# Patient Record
Sex: Female | Born: 1935
Health system: Southern US, Community
[De-identification: ages and names within clinical notes are randomized; demographics above are authoritative.]

## PROBLEM LIST (undated history)

## (undated) DIAGNOSIS — R269 Unspecified abnormalities of gait and mobility: Secondary | ICD-10-CM

## (undated) DIAGNOSIS — I340 Nonrheumatic mitral (valve) insufficiency: Secondary | ICD-10-CM

## (undated) DIAGNOSIS — I10 Essential (primary) hypertension: Secondary | ICD-10-CM

## (undated) DIAGNOSIS — R519 Headache, unspecified: Secondary | ICD-10-CM

## (undated) DIAGNOSIS — M069 Rheumatoid arthritis, unspecified: Secondary | ICD-10-CM

## (undated) DIAGNOSIS — R413 Other amnesia: Secondary | ICD-10-CM

## (undated) DIAGNOSIS — R55 Syncope and collapse: Secondary | ICD-10-CM

## (undated) DIAGNOSIS — R51 Headache: Secondary | ICD-10-CM

## (undated) DIAGNOSIS — E785 Hyperlipidemia, unspecified: Secondary | ICD-10-CM

## (undated) DIAGNOSIS — K219 Gastro-esophageal reflux disease without esophagitis: Secondary | ICD-10-CM

## (undated) HISTORY — PX: NASAL SINUS SURGERY: SHX719

## (undated) HISTORY — DX: Nonrheumatic mitral (valve) insufficiency: I34.0

## (undated) HISTORY — DX: Essential (primary) hypertension: I10

## (undated) HISTORY — DX: Headache: R51

## (undated) HISTORY — DX: Headache, unspecified: R51.9

## (undated) HISTORY — PX: SHOULDER SURGERY: SHX246

## (undated) HISTORY — DX: Other amnesia: R41.3

## (undated) HISTORY — PX: ABDOMINAL HYSTERECTOMY: SHX81

## (undated) HISTORY — DX: Syncope and collapse: R55

## (undated) HISTORY — DX: Rheumatoid arthritis, unspecified: M06.9

## (undated) HISTORY — DX: Hyperlipidemia, unspecified: E78.5

## (undated) HISTORY — DX: Unspecified abnormalities of gait and mobility: R26.9

## (undated) HISTORY — DX: Gastro-esophageal reflux disease without esophagitis: K21.9

---

## 2002-01-27 ENCOUNTER — Ambulatory Visit (HOSPITAL_BASED_OUTPATIENT_CLINIC_OR_DEPARTMENT_OTHER): Admission: RE | Admit: 2002-01-27 | Discharge: 2002-01-27 | Payer: Self-pay | Admitting: *Deleted

## 2002-01-27 ENCOUNTER — Encounter (INDEPENDENT_AMBULATORY_CARE_PROVIDER_SITE_OTHER): Payer: Self-pay | Admitting: *Deleted

## 2002-11-23 ENCOUNTER — Ambulatory Visit (HOSPITAL_COMMUNITY): Admission: RE | Admit: 2002-11-23 | Discharge: 2002-11-24 | Payer: Self-pay | Admitting: Internal Medicine

## 2004-09-23 ENCOUNTER — Ambulatory Visit: Payer: Self-pay | Admitting: Internal Medicine

## 2005-01-09 ENCOUNTER — Ambulatory Visit: Payer: Self-pay

## 2005-01-15 ENCOUNTER — Ambulatory Visit: Payer: Self-pay | Admitting: Internal Medicine

## 2005-01-21 ENCOUNTER — Ambulatory Visit: Payer: Self-pay | Admitting: Internal Medicine

## 2005-01-27 ENCOUNTER — Ambulatory Visit: Payer: Self-pay | Admitting: Internal Medicine

## 2005-01-27 ENCOUNTER — Ambulatory Visit (HOSPITAL_COMMUNITY): Admission: RE | Admit: 2005-01-27 | Discharge: 2005-01-27 | Payer: Self-pay | Admitting: Internal Medicine

## 2005-02-12 ENCOUNTER — Ambulatory Visit: Payer: Self-pay

## 2005-06-02 ENCOUNTER — Ambulatory Visit: Payer: Self-pay

## 2005-10-02 ENCOUNTER — Ambulatory Visit: Payer: Self-pay

## 2006-01-02 ENCOUNTER — Ambulatory Visit: Payer: Self-pay | Admitting: Internal Medicine

## 2006-05-07 ENCOUNTER — Ambulatory Visit: Payer: Self-pay

## 2006-08-18 ENCOUNTER — Ambulatory Visit: Payer: Self-pay | Admitting: Internal Medicine

## 2006-10-02 ENCOUNTER — Ambulatory Visit (HOSPITAL_COMMUNITY): Admission: RE | Admit: 2006-10-02 | Discharge: 2006-10-02 | Payer: Self-pay | Admitting: Orthopedic Surgery

## 2006-10-12 ENCOUNTER — Encounter: Admission: RE | Admit: 2006-10-12 | Discharge: 2006-10-12 | Payer: Self-pay | Admitting: Orthopedic Surgery

## 2006-10-14 ENCOUNTER — Ambulatory Visit (HOSPITAL_BASED_OUTPATIENT_CLINIC_OR_DEPARTMENT_OTHER): Admission: RE | Admit: 2006-10-14 | Discharge: 2006-10-14 | Payer: Self-pay | Admitting: Orthopedic Surgery

## 2006-12-23 ENCOUNTER — Ambulatory Visit: Payer: Self-pay | Admitting: Internal Medicine

## 2007-03-29 ENCOUNTER — Ambulatory Visit: Payer: Self-pay | Admitting: Internal Medicine

## 2007-03-29 LAB — CONVERTED CEMR LAB
Basophils Absolute: 0 10*3/uL (ref 0.0–0.1)
Eosinophils Absolute: 0.2 10*3/uL (ref 0.0–0.6)
HCT: 37.3 % (ref 36.0–46.0)
INR: 0.9 (ref 0.9–2.0)
Lymphocytes Relative: 32.2 % (ref 12.0–46.0)
MCHC: 35.7 g/dL (ref 30.0–36.0)
MCV: 101.3 fL — ABNORMAL HIGH (ref 78.0–100.0)
Monocytes Absolute: 0.5 10*3/uL (ref 0.2–0.7)
Monocytes Relative: 6.1 % (ref 3.0–11.0)
Neutro Abs: 5.3 10*3/uL (ref 1.4–7.7)
RDW: 12.4 % (ref 11.5–14.6)

## 2007-04-07 ENCOUNTER — Ambulatory Visit (HOSPITAL_COMMUNITY): Admission: RE | Admit: 2007-04-07 | Discharge: 2007-04-07 | Payer: Self-pay | Admitting: Internal Medicine

## 2007-04-07 ENCOUNTER — Ambulatory Visit: Payer: Self-pay | Admitting: Internal Medicine

## 2007-04-14 ENCOUNTER — Ambulatory Visit: Payer: Self-pay

## 2007-06-28 ENCOUNTER — Encounter: Admission: RE | Admit: 2007-06-28 | Discharge: 2007-06-28 | Payer: Self-pay | Admitting: Orthopedic Surgery

## 2008-04-26 IMAGING — CR DG CHEST 2V
2 series · 2 of 2 positions shown · non-contrast
Comparison: 10/12/06.

CLINICAL DATA: Pre-hardware removal.
 CHEST - 2 VIEW:

[view not recorded (1 of 2)]
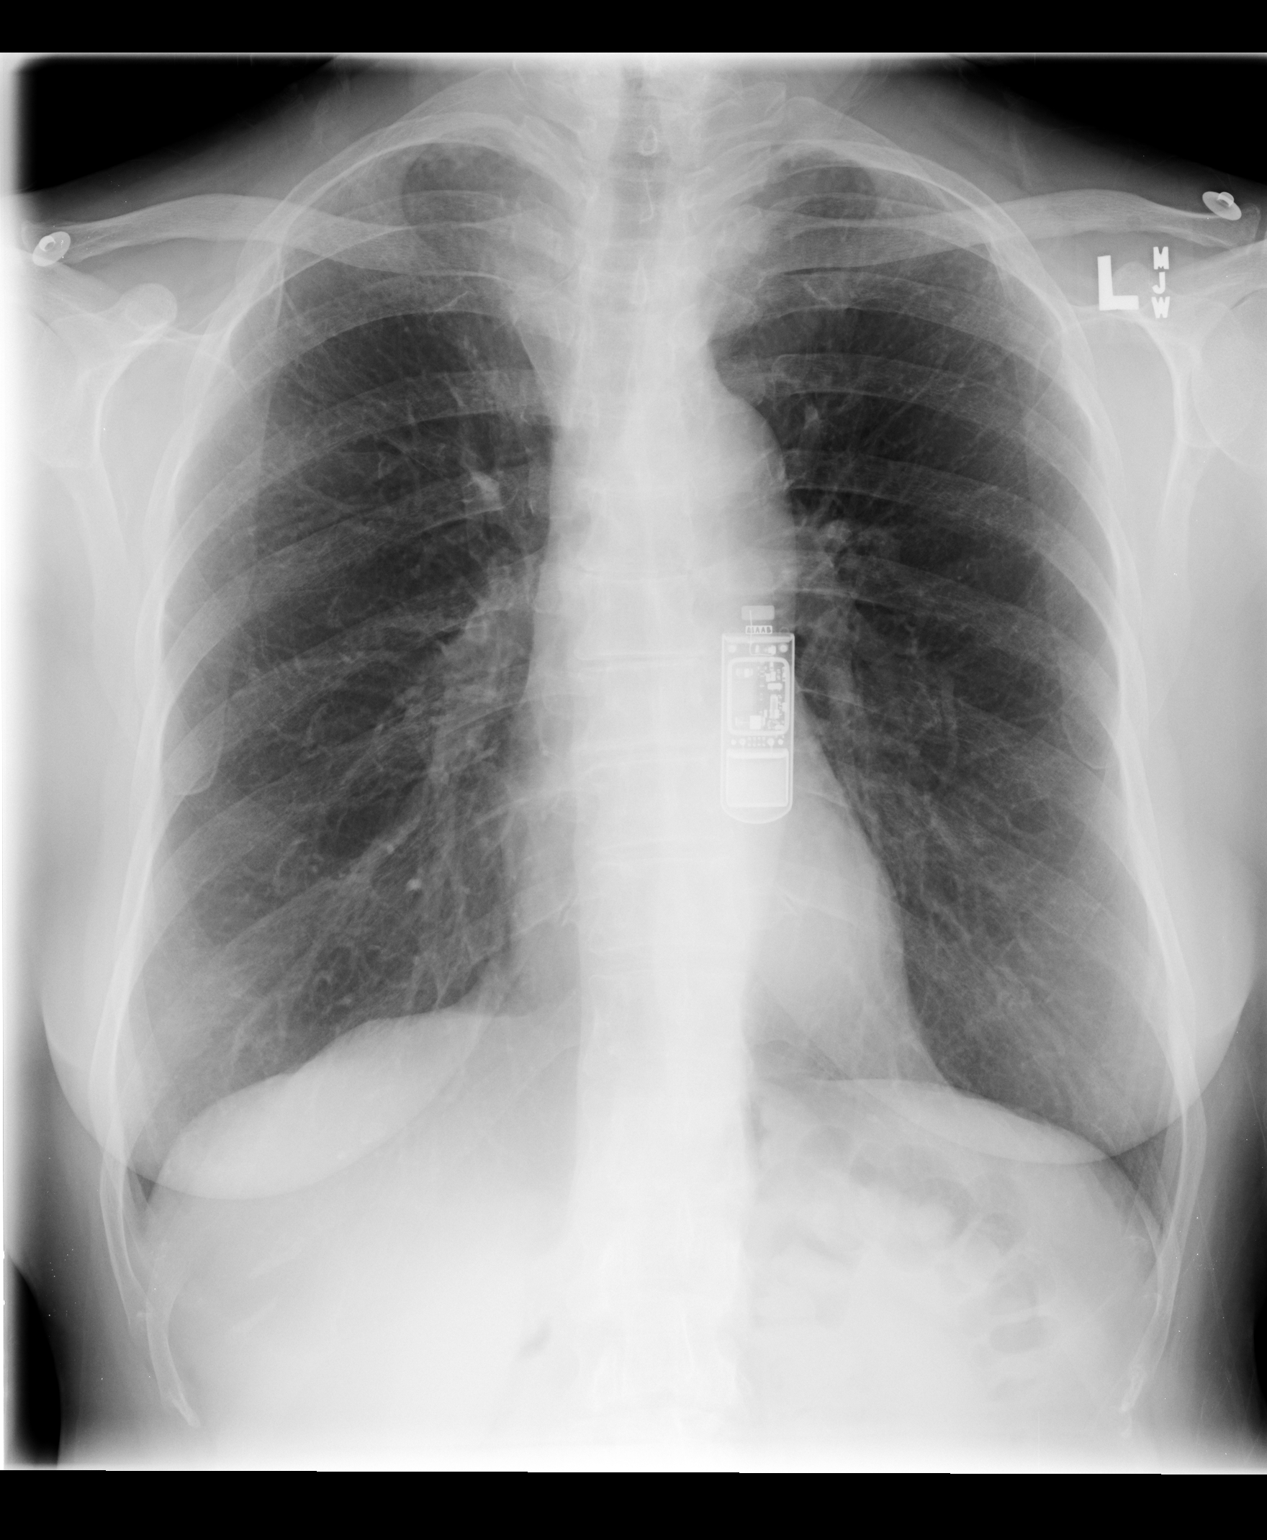

[view not recorded (2 of 2)]
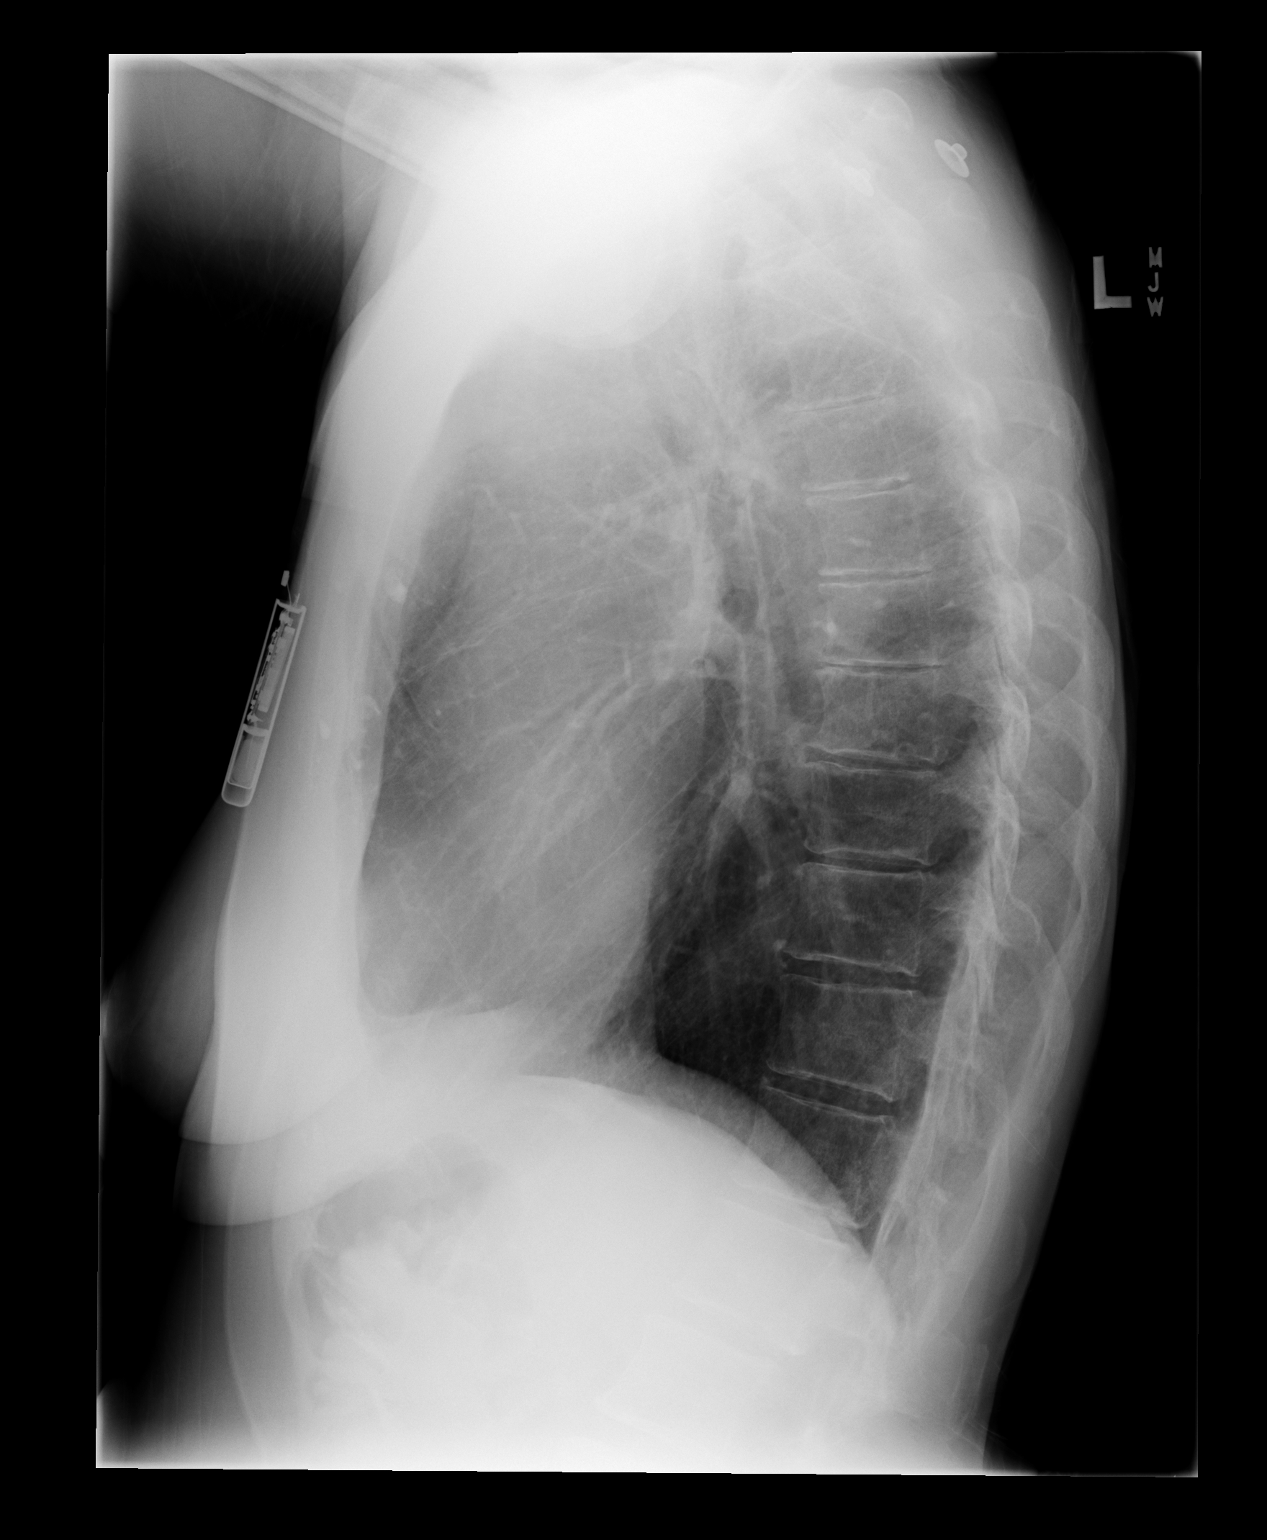

[2 of 2 positions shown; findings below may reference images not displayed]

FINDINGS: The trachea is midline. Heart size is normal. The lungs are clear. No pleural fluid.
IMPRESSION: No acute findings.

## 2008-09-08 HISTORY — PX: KNEE SURGERY: SHX244

## 2010-05-02 ENCOUNTER — Ambulatory Visit: Payer: Self-pay | Admitting: Cardiovascular Disease

## 2010-05-06 ENCOUNTER — Telehealth: Payer: Self-pay | Admitting: Cardiovascular Disease

## 2010-05-15 ENCOUNTER — Telehealth (INDEPENDENT_AMBULATORY_CARE_PROVIDER_SITE_OTHER): Payer: Self-pay | Admitting: *Deleted

## 2010-05-15 ENCOUNTER — Telehealth: Payer: Self-pay | Admitting: Cardiovascular Disease

## 2010-05-22 ENCOUNTER — Telehealth: Payer: Self-pay | Admitting: Cardiovascular Disease

## 2010-05-28 ENCOUNTER — Ambulatory Visit: Payer: Self-pay | Admitting: Cardiovascular Disease

## 2010-06-03 ENCOUNTER — Telehealth: Payer: Self-pay | Admitting: Cardiovascular Disease

## 2010-08-26 ENCOUNTER — Ambulatory Visit: Payer: Self-pay | Admitting: Cardiovascular Disease

## 2010-10-08 NOTE — Progress Notes (Signed)
  Phone Note Outgoing Call   Call placed by: Dessie Coma  LPN,  May 22, 2010 9:13 AM Call placed to: Patient Summary of Call: Patient notified per Dr. Kirke Corin, Echo was normal.

## 2010-10-08 NOTE — Progress Notes (Signed)
Summary: HEADACHE WITH NEW MEDICATION  Phone Note Call from Patient   Summary of Call: PT CALLED STATING SINCE STARTING BYSTOLIC SHE HAS HAD TERRIBLE HEADACHES.  IS THIS A SIDE EFFECT AND WHAT SHOULD SHE DO? Initial call taken by: Park Breed,  May 06, 2010 3:41 PM  Follow-up for Phone Call        Phone Call Completed:Advised per DR. Arida, need to cut the Bystolic in half.  It is a side effect to have bad headacaches.  It h/a does not get better, may have to switch to something else.  Follow-up by: Dessie Coma  LPN,  May 06, 2010 4:09 PM

## 2010-10-08 NOTE — Progress Notes (Signed)
Summary: QUESTION ABOUT PRES DOSE  Phone Note Call from Patient   Summary of Call: HAS PRESCRIPTION FOR BYSTOLIC AND WANTS TO CHECK DOSAGE BEFORE GETTING IT FILLED.  PLEASE CALL HER AT [phone] AND LET HER KNOW CORRECT DOSE.  [phone] Initial call taken by: Park Breed,  June 03, 2010 9:52 AM  Follow-up for Phone Call        Phone Call Completed:  PATIENT NOTIFIED CURRENTLY SHOULD BE TAKING BYSTOLIC 10MG  AS ORDERED BY DR. Kirke Corin ON THE 20TH OF SEPT.  PATIENT VOICED UNDERSTANDING.  Follow-up by: Dessie Coma  LPN,  June 03, 2010 10:21 AM

## 2010-10-08 NOTE — Progress Notes (Signed)
----   Converted from flag ---- ---- 05/15/2010 9:34 AM, Marilynne Halsted, CMA, AAMA wrote: Pt has Blue MCR.  No precert required for either [facility] or [facility] for 2D echo  ---- 05/15/2010 9:27 AM, Dessie Coma  LPN wrote: Echo scheduled at [facility]. for Friday 05/17/10 at [facility]. office.  Dx:401.1.  Thanks, Victorino Dike ------------------------------

## 2010-10-08 NOTE — Progress Notes (Signed)
  Phone Note Outgoing Call   Call placed by: Dessie Coma  LPN,  May 15, 2010 9:24 AM Call placed to: Patient Summary of Call: Patient notified per Dr. Kirke Corin, needs ECHO for hypertension.  This procedure scheduled at [facility]. on [place] 05/17/10 at 1:30pm.

## 2011-01-21 NOTE — Assessment & Plan Note (Signed)
[facility]                        [place] [facility] OFFICE NOTE   NAME:[name], Sandra [name]                        MRN:          [id number]  DATE:05/28/2010                            DOB:          11-06-1933    This is a 77 year old female who is here today for a followup visit.  She has the following problem list:  1. Hypertension.  2. Rheumatoid arthritis.  3. Hyperlipidemia.   INTERIM HISTORY:  I saw Sandra [name] recently for uncontrolled  hypertension.  At that time, I started her on Bystolic 5 mg once daily.  Initially, she started having headache with it, but her symptoms  improved after few days.  She has been recording her blood pressure at  home and there has been significant improvement in overall blood  pressure.  She does have low few readings that are around 160.  Most of  the other readings are less than 140 systolic.  She denies any chest  pain.  She has chronic exertional dyspnea.  There is no syncope or  dizziness.  She did have an abnormal electrocardiogram and thus an  echocardiogram was performed.   MEDICATIONS:  1. Methotrexate 2.5 mg 4 tablets a week.  2. Folic acid 1 mg once daily.  3. Losartan/hydrochlorothiazide 100/25 mg once daily.  4. Vitamin D.  5. Fish oil.  6. Aspirin 81 mg daily.  7. Bystolic 5 mg once daily.  8. Arthrotec 50 mg twice daily.  9. Tramadol 50 mg once daily.   PHYSICAL EXAMINATION:  VITAL SIGNS:  Weight is 148.8 pounds, blood  pressure is 138/71, pulse is 56, oxygen saturation is 97% on room air.  NECK:  Reveals no JVD or carotid bruits.  LUNGS:  Clear to auscultation.  HEART:  Regular rate and rhythm.  There are no gallops.  ABDOMEN:  Benign, nontender, nondistended.  EXTREMITIES:  With no clubbing, cyanosis, or edema.   IMPRESSION:  1. Hypertension:  Her blood pressure is much better controlled now,      since hydrochlorothiazide was added followed by a Bystolic.  It is      still not optimal  though with some readings running around 160      systolic.  It is possible that some of her pain medications might      be causing some of this including all nonsteroidal anti-      inflammatory medications.  Obviously, she is using those due to      significant back discomfort.  She is going to have an epidural      injection tomorrow.  If she is able to get off Arthrotec, her might      blood pressure might improve.  In the meanwhile, I will go ahead      and increase Bystolic to 10 mg once daily.  I do not think we will      be able to increase it any more than 10 mg due to her baseline      bradycardia.  We did labs which overall showed normal sodium and      potassium  as well as renal function.  She did have hypercalcemia      which seems to be not a new finding.  2. Hypertensive heart disease:  An echocardiogram was performed due to      her abnormal EKG and prolonged history of hypertension.  It showed      normal left ventricular systolic function with mild left      ventricular hypertrophy.  There was mild-to-moderate mitral      regurgitation and mild pulmonary hypertension.  She will need to      have a followup echocardiogram in about 3-year to follow up on her      mitral regurgitation or earlier if needed based on her symptoms.      In the meantime, controlling her blood pressure should definitely      help.  3. Hyperlipidemia:  Lipid profile showed total cholesterol 215, HDL      40.8, triglyceride 149, and LDL was 144.4.  At this time, I have      discussed with her the importance of lifestyle modifications      including diet and exercise.  She is going to try this for few      months.  Her lipid profile should be checked in the near future.      If it remains above 130, then we will consider small-dose statin.  4. Hypercalcemia:  Likely due to hyperparathyroidism.  I suggested      that she follows up with Dr. Sherral Hammers for further evaluation or with      the  Endocrinology if indicated.  She will follow up with me in 6      months from now or earlier if needed.     Sandra [name], Sandra [name]  Electronically Signed    MA/MedQ  DD: 05/28/2010  DT: 05/29/2010  Job #: [id number]

## 2011-01-21 NOTE — Op Note (Signed)
[name], Sandra [name]                 ACCOUNT NO.:  [id number]   MEDICAL RECORD NO.:  [id number]          PATIENT TYPE:  OIB   LOCATION:  2899                         FACILITY:  [facility]   PHYSICIAN:  Duke Salvia, MD, FACCDATE OF BIRTH:  09-03-1934   DATE OF PROCEDURE:  04/07/2007  DATE OF DISCHARGE:  04/07/2007                               OPERATIVE REPORT   PREOPERATIVE DIAGNOSIS:  Syncope with previously implanted loop recorder  now at end of life.   POSTOPERATIVE DIAGNOSIS:  Syncope with previously implanted loop  recorder now at end of life.   PROCEDURE:  Explantation of a previously implanted loop recorder.   DESCRIPTION OF PROCEDURE:  Following obtaining informed consent, the  patient was brought to the catheterization laboratory and placed on the  fluoroscopic table in supine position.  After routine prep and drape,  lidocaine was infiltrated along the line of the previous incision  carried down to layer of the device pocket using sharp dissection.  The  pocket was opened.  The device was explanted.   The pocket was irrigated with antibiotic containing saline solution.  The anterior and posterior wall of the pocket were apposed with a 2-0  Vicryl suture.   The wound was then closed in three layers in normal fashion.  Wound was  washed, dried and a benzoin Steri-Strip dressing was applied.  Needle  counts, sponge counts and instrument counts were correct at the end of  the procedure according to staff.  The patient tolerated the procedure  without apparent complication.      Duke Salvia, MD, [facility]  Electronically Signed     SCK/MEDQ  D:  04/07/2007  T:  04/08/2007  Job:  [id number]

## 2011-01-21 NOTE — Letter (Signed)
March 29, 2007    Keturah Barre, MD  [address]  [place], [place] [address]   RE:  Sandra [name], Sandra [name]  MRN:  [id number]  /  DOB:  December 25, 1933   Dear Cleone Slim:   Sandra [name] comes in today.  She has had no recurrent syncope.  Her loop  recorder is now at [facility].   We will plan to explant it.  We will check her CBC and INR today.   I have reviewed the procedure with her and her brother.  They understand  and would like to proceed.   Her examination was unremarkable.  Vital signs were stable.  Lungs were  clear and heart sounds were regular.    Sincerely,      Duke Salvia, MD, [facility]  Electronically Signed    SCK/MedQ  DD: 03/29/2007  DT: 03/29/2007  Job #: [id number]

## 2011-01-21 NOTE — Assessment & Plan Note (Signed)
[facility]                        [place] [facility] OFFICE NOTE   NAME:[name], [name]                        MRN:          [id number]  DATE:05/02/2010                            DOB:          1934/08/25    PRIMARY CARE PHYSICIAN:  Dr. Keturah Barre.   CHIEF COMPLAINT:  Hypertension.   HISTORY OF PRESENT ILLNESS:  Sandra [name] is a 77 year old female with no  previous cardiac history.  She is self-referred for evaluation of  hypertension.  She has history of hypertension for many years.  However,  recently it has been difficult to control.  She presented to Dr.  Sherral Hammers' office about 2 weeks ago, where she was found to have blood  pressure of 217/97.  At that time, hydrochlorothiazide was added to  losartan.  Since then, there has been significant improvement in her  blood pressure.  I reviewed her blood pressure readings with her and  these revealed systolic blood pressure ranging from 140-170.  Overall,  she is asymptomatic.  She has chronic exertional dyspnea, but there is  no chest pain.  There is no dizziness, presyncope, or syncope.  The  patient does have history of recurrent syncope in the past which was  evaluated by a loop recorder with no significant findings.   PAST MEDICAL HISTORY:  1. Hypertension.  2. Rheumatoid arthritis.   MEDICATIONS:  1. Methotrexate 2.5 mg 6 tablets a week.  2. Folic acid 1 mg once daily.  3. Losartan/hydrochlorothiazide 100/25 mg once daily.  4. Vitamin D once daily.  5. Fish oil.  6. Aspirin 81 mg once daily.   ALLERGIES:  Include PENICILLIN, SULFA, AUGMENTIN, CODEINE, CIPRO, and  IODINE.  Metoprolol is listed as allergy, however, this caused nausea  according to the patient and does not seem to be a true allergy.   SOCIAL HISTORY:  Negative for smoking, alcohol, or drug use.   PAST SURGICAL HISTORY:  Includes shoulder surgery.   FAMILY HISTORY:  Remarkable for coronary artery disease.  Her mother  had  coronary artery bypass surgery when she was in her 2s.   REVIEW OF SYSTEMS:  Remarkable for dyspnea and generalized fatigue.  There is no dizziness, palpitations, or syncope.  A full review of  system was performed and was otherwise negative.   PHYSICAL EXAMINATION:  GENERAL:  The patient appears to be in no acute  distress.  VITAL SIGNS:  Weight is 143.6 pounds, blood pressure is 142/92, oxygen  saturation is 98% on room air.  Pulse is 83 beats per minute.  HEENT:  Normocephalic and atraumatic.  NECK:  Reveals no JVD or carotid bruits.  RESPIRATORY:  Normal respiratory effort with no use of accessory  muscles.  Auscultation reveals normal breath sounds.  CARDIOVASCULAR:  Normal PMI.  Normal S1, S2 with no gallops or murmurs.  ABDOMEN:  Benign, nontender, nondistended.  EXTREMITIES:  With no clubbing, cyanosis, or edema.  SKIN:  Warm and dry with no rash.  PSYCHIATRIC:  She is alert and oriented x3 with normal mood and affect.  MUSCULOSKELETAL:  She has normal muscle strength in  the upper and lower  extremities.  There are deformities in both hands, suggestive of  rheumatoid arthritis.   IMPRESSION:  1. Uncontrolled hypertension:  The patient's recent blood pressure was      above 200.  Since then, she was started on hydrochlorothiazide with      subsequent improvement.  However, her blood pressure is still      running above 140 most of the time.  Thus, further control is      needed.  I will go head and start her on Bystolic 5 mg once daily      which can be up titrated as needed.  Also, given that she was      started on hydrochlorothiazide recently, I will go ahead and order      comprehensive metabolic profile.  This will help [place] also to      identify if there are any suggestion of secondary hypertension.      One etiology might have to look into is the possibility of renal      artery stenosis.  However, the recent data really does not support      renal artery  revascularization for the sole purpose of blood      pressure control unless the stenosis is very significant.  We will      also check her fasting lipid profile as well.  2. Abnormal ECG which showed normal sinus rhythm with biatrial      enlargement and poor R-wave progression in anterior leads.  Given      her history of hypertension with this abnormality, probably she      will need an echocardiogram.  Her medical record is not available      to me and I am not sure what kind of testing she had in the past.      We will request her records from Dr. Sherral Hammers' office, so that we do      not duplicate any testing.  The patient will follow up in a month      from now or earlier if needed.  She was given samples for Bystolic      and if that controls her blood pressure, we will give her a      prescription.     Sandra Bears, MD  Electronically Signed    MA/MedQ  DD: 05/02/2010  DT: 05/03/2010  Job #: [id number]

## 2011-01-21 NOTE — Assessment & Plan Note (Signed)
[facility]                                 ON-CALL NOTE   NAME:Sandra [name], Sandra [name]                        MRN:          [id number]  DATE:04/10/2007                            DOB:          1933/12/01    PRIMARY CARDIOLOGIST:  Dr. Simona Huh.   Sandra [name] was recently in the hospital to have a loop recorder  explanted. This was performed on 04/07/2007 without complication and she  was discharged home. During the procedure, there is documented  clindamycin 600 mg, kanamycin sulfate, and Lidocaine anesthetic. Today,  Sandra [name] called and stated that she had developed a rash the day after  the procedure, which has steadily gotten worse. She stated that she had  tried over-the-counter Benadryl, but this was not helping and she was  concerned, requesting more treatment. She stated that she had rashes in  response to several medications before including penicillin, sulfa,  vancomycin, Cipro, and Betadine. She stated that this rash was like her  previous rashes.   Sandra [name] stated that she was not short of breath and was having no  difficulty swallowing and no swelling of her oropharynx. She stated that  the rash was pruritic, but she was not running a fever or having any  other issues or problems. She stated that the explantation site was  without any significant drainage, swelling, or redness. I told Ms.  [name] that I would call her in a Sterapred dose pack which I have done.  She stated that she has a follow up appointment with Dr. Graciela Husbands this  coming week and I advised her to please keep that. If her symptoms do  not improve, she is to contact her family doctor.      Theodore Demark, PA-C  Electronically Signed      Doylene Canning. Ladona Ridgel, MD  Electronically Signed   RB/MedQ  DD: 04/10/2007  DT: 04/11/2007  Job #: [id number]

## 2011-01-24 NOTE — Op Note (Signed)
Sandra [name], Sandra [name]                 ACCOUNT NO.:  [id number]   MEDICAL RECORD NO.:  [id number]          PATIENT TYPE:  OIB   LOCATION:  2899                         FACILITY:  [facility]   PHYSICIAN:  Duke Salvia, M.D.  DATE OF BIRTH:  10/15/1933   DATE OF PROCEDURE:  01/27/2005  DATE OF DISCHARGE:  01/27/2005                                 OPERATIVE REPORT   This is canceled because there is already an operative report dictated.           ______________________________  Duke Salvia, M.D.     SCK/MEDQ  D:  05/14/2005  T:  05/15/2005  Job:  [id number]

## 2011-01-24 NOTE — Op Note (Signed)
[name], Sandra [name]                 ACCOUNT NO.:  [id number]   MEDICAL RECORD NO.:  [id number]          PATIENT TYPE:  AMB   LOCATION:  DSC                          FACILITY:  [facility]   PHYSICIAN:  Mila Homer. Sherlean Foot, M.D. DATE OF BIRTH:  February 24, 1934   DATE OF PROCEDURE:  10/14/2006  DATE OF DISCHARGE:                               OPERATIVE REPORT   SURGEON:  Mila Homer. Sherlean Foot, M.D.   ASSISTANT:  None.   ANESTHESIA:  General.   ANESTHESIA:  MAC.   PREOPERATIVE DIAGNOSIS:  Left knee osteoarthritis plica and medial and  lateral meniscus tears.   POSTOPERATIVE DIAGNOSIS:  Left knee osteoarthritis plica and medial and  lateral meniscus tears.   PROCEDURE:  Left knee arthroscopy with plica resection, partial lateral  meniscectomy, partial medial meniscectomy.   INDICATIONS FOR PROCEDURE:  The patient is a 77 year old white female  with mechanical symptoms and MRI evidence of meniscus tearing.  Informed  consent was obtained.   DESCRIPTION OF PROCEDURE:  The patient was laid supine and administered  MAC anesthesia, left leg was prepped and draped in usual sterile  fashion.  Inferolateral anteromedial portals were created with a #11  blade, blunt trocar and cannula.  Diagnostic arthroscopy revealed  chondromalacia grade II in the patellofemoral joint, grade 3 and the  depths of the trochlea, grade I and II on the medial femoral condyle,  grade III and IV on the lateral tibial plateaus.  Complex radial tearing  of the posterior horn medial meniscus.  A partial medial meniscectomy  was performed a [employer] and a straight basket forceps.  Went  into the figure-of-eight position and there was a very, very complex  tear of nearly the entire lateral meniscus.  This was debrided with  straight basket forceps.  I basically had removed the entire anterior  horn of the meniscus back to the popliteus hiatus.  I then went into  extension and removed the plica and extension which was  impinging in the  patellofemoral joint and causing wear on the femoral condyle.  I then  lavaged and closed with 4-0 nylon sutures, infiltrated with 10 mL of  Marcaine/morphine mixture.           ______________________________  Mila Homer Sherlean Foot, M.D.     SDL/MEDQ  D:  10/14/2006  T:  10/14/2006  Job:  [id number]

## 2011-01-24 NOTE — Letter (Signed)
August 18, 2006    Keturah Barre, M.D.  [address] [place], [place] [address]   RE:  SACHI, BOULAY  MRN:  [id number]  /  DOB:  21-Feb-1934   Dear Cleone Slim:   First let me wish you and your family a very Merry Christmas.  I look  forward to seeing you.   Ms. Ranum comes in.  She has had no recurrent syncope.  On our  examination, however, today her blood pressure is 164/66 and she says  the last couple times she has seen the doctor it has been over 160.  Her  pulse is 74, lungs were clear, heart sounds were regular, and  extremities were without edema.   Her medications were notable for Benicar HCT 20/12.5.  I have taken the  liberty of increasing her Benicar with samples that we had to 40/25 and  I asked that she follow up with you in a couple of weeks to get her BUN  and creatinine and potassium rechecked.   We will plan to see her again in 4 months' time to look at her loop  recorder.    Sincerely,      Duke Salvia, MD, [facility]  Electronically Signed    SCK/MedQ  DD: 08/18/2006  DT: 08/18/2006  Job #: [id number]

## 2011-01-24 NOTE — Op Note (Signed)
[place]  Patient:    Sandra [name], LUMB Visit Number: [id number] MRN: [id number]          Service Type: DSU Location: [facility] Attending Physician:  Carlena Sax Dictated by:   Veverly Fells. Arletha Grippe, M.D. Proc. Date: 01/27/02 Admit Date:  01/27/2002 Discharge Date: 01/27/2002   CC:         Dr. Sherral Hammers in [place]   Operative Report  PREOPERATIVE DIAGNOSES:  Left chronic frontal sinusitis with chronic ethmoid sinusitis and with chronic maxillary sinusitis.  POSTOPERATIVE DIAGNOSES:  Left chronic frontal sinusitis with chronic ethmoid sinusitis and with chronic maxillary sinusitis.  PROCEDURE PERFORMED:  Left revision of endoscopic maxillary antrostomy and left endoscopic revision of frontal recess exploration using the InstaTrak System for stereotactic computer system navigation.  SURGEON:  Veverly Fells. Arletha Grippe, M.D.  ANESTHESIA:  General endotracheal.  INDICATIONS FOR PROCEDURE:  This is a 77 year old female who has had prior endoscopic sinus surgery in October of 2002 at an outside institution.  She has been having problems with left-sided periorbital pain, purulent drainage, and nasal congestion after surgery.  She has been treated with numerous courses of antibiotics, and also prolonged course of antibiotics by myself. Endoscopic examination of nasal chambers did show gross mucopurulent material involving the left frontal recess area.  Post treatment CT scan of the sinus showed opacification of the frontal, ethmoid, and maxillary sinus on the left side.  Based on her history and physical examination, and failure of aggressive medical therapy, I have recommended proceeding with the above noted surgical procedure.  I discussed extensively with her and her family, the risks and benefits of surgery including risks of general anesthesia, infection, orbital, or CNS injury, need for possible light nasal packing, and the normal recovery period expected  after this type of surgery.  I have entertained any questions, answered them appropriately, informed consent has been obtained, and the patient presents for the above noted procedure.  OPERATIVE FINDINGS:  Thickened mucosa involving the frontal recess area, the ethmoid cavity, and maxillary sinus area.  DESCRIPTION OF PROCEDURE:  The patient was brought to the operating room and placed in the supine position.  General endotracheal anesthesia was administered via the anesthesiologist without complications.  The patient was administered 1 g of Ancef IV x1, and 10 mg of Decadron IV x1.  The head of the table was elevated 30 degrees.  Cotton pledgets soaked in a 4% cocaine solution were placed in both nares and left in place for approximately 5-10 minutes and then removed.  A throat pack was place in the posterior pharynx using a small baby lap sponge.  Bilateral sphenopalatine blocks were administered via the greater palatine foramen, each with 1.5 cc of a 1% lidocaine solution with 1:100,000 epinephrine.  The patients face was draped in the standard fashion.  Using endoscopic guidance, the left middle meatus was identified and the middle meatus was packed with cotton pledgets soaked in a 4% cocaine solution which were left in place for approximately 5-10 minutes and then removed.  The InstaTrak head piece was then placed on the patients head and was calibrated to the straight suction aspirator to perform stereotactic computer assisted navigation.  This was done in lieu of the prior surgery and for looking and revealing the frontal recess area for safety purposes.  After this was done, using 0 degree rigid endoscopic guidance, the remnants of the uncinate process was back elevated using the micro Franz back elevator. The remaining portion of the uncinate  was then removed using a microdebrider. A 30 and then a 70 degree rigid endoscope was used to identify the context of the maxillary  sinus.  A small mucous retention cyst was noted at the bottom of the maxillary sinus, but no other lesions or masses were noted, and no further biopsies were taken.  Next, using the image-guided system, 0 degree endoscopy and the microdebrider, and anterior and posterior ethmoidectomy was performed from an anterior and posterior fashion, not to violate lamina, papyracea or skull base.  Thick bone in the frontal recess area was then debrided using a small curet.  Using a 30 degree telescope, the frontal recess was identified, and was reconfirmed with a curved InstaTrak probe.  This area was enlarged using the microdebrider anteriorly and with good opening of the frontal recess itself.  After this was done, a mixture of Kenalog 40 and Bactroban ointment was instilled into the frontal recess into the ethmoid cavity and the maxillary sinus through the enlarged ostium on the left side.  A Kennedy pack which was trimmed and soaked in the same Kenalog and Bactroban ointment solution was placed in the middle meatus and this area was also bathed with the Kenalog 40 and Bactroban ointment solution.  The throat pack was removed and the orogastric was placed.  This was used to decompress the stomach contents.  It was then removed without incident.  Fluids given during procedure was approximately 1 L of crystalloid.  Estimated blood loss was less than 30 cc.  Urine output was not measured.  There were no drains.  The above noted high Kennedy packs were placed.  The specimens sent were sinus contents, left side.  The patient tolerated the procedure well without complication, was extubated in the operating room, and transferred to recovery in stable condition. Sponge, instrument, and needle counts were correct at the end of the procedure.  Total duration of the procedure was approximately 1-1/2 hours. The patient will be discharged to home when she has recovered here in the recovery room, and she  will be sent home on Levaquin 500 mg p.o. q.d. for  14 days, and Vicodin #30 with two refills one to two tablets p.o. q.4h. p.r.n. pain.  She is to have light activity, no heavy lifting, or nose blowing for two weeks after surgery.  Both her and her family were given oral and written instructions.  They were to call with any problems with bleeding, fever, or vomiting, pain, reaction to medications, or any other questions.  She will follow up in the office for packing and endoscopic debridement of sinus cavity approximately one week after discharge. Dictated by:   Veverly Fells. Arletha Grippe, M.D. Attending Physician:  Carlena Sax DD:  01/27/02 TD:  01/29/02 Job: [id number]

## 2011-01-24 NOTE — Op Note (Signed)
Sandra [name], Sandra [name]                             ACCOUNT NO.:  [id number]   MEDICAL RECORD NO.:  [id number]                   PATIENT TYPE:  OIB   LOCATION:  2899                                 FACILITY:  [facility]   PHYSICIAN:  Duke Salvia, M.D. [facility]           DATE OF BIRTH:  1933-11-28   DATE OF PROCEDURE:  11/23/2002  DATE OF DISCHARGE:                                 OPERATIVE REPORT   PREOPERATIVE DIAGNOSIS:  Recurrent syncope.   POSTOPERATIVE DIAGNOSIS:  Recurrent syncope.   PROCEDURE:  Implantation of a loop recorder.   COMPLICATIONS:  Chills, shakes and erythema, temporarily associated with  exposure topical Betadine and the patient with a known iodine allergy  history, as well as vancomycin.   DESCRIPTION OF PROCEDURE:  On the obtainment of informed consent, the  patient was brought to the electrophysiology laboratory and placed on the  fluoroscopic table in the supine position.  After routine prep and drape,  the patient developed some chills, shakes and an erythematous face.  She was  given Benadryl with some amelioration of her symptoms.  It was not clear  whether this was a reaction to the Betadine or to vancomycin.  The  vancomycin infusion was by this time over.  Her vital signs were stable and  it was elected to proceed.  Lidocaine was infiltrated approximately 2 cm  caudal to the clavicle and 1 cm lateral to the sternum transversely.  An  incision was made and carried to the layer of the prepectoral fascia using  sharp dissection and electrocautery.  A pocket was formed similarly, and  with blade primary caudally directed pocket and some cephalad exposure as  well.   Hemostasis was obtained.  Two _____ sutures were placed in the cephalad  portion of the pocket, and a Medtronic Reveal-Plus implantable loop recorder  was affixed  (Ser. No. [id number]).  The pocket was copiously irrigated with antibiotic-  containing saline solution.  The loop recorder was secured  and the wound was  then closed in three layers in the normal fashion.  The wound was washed,  dried, and Benzoin and Steri-Strips dressing was applied.  Sponge, needle  and instrument counts were correct at the end of the procedure, according to  the Staff.   The patient received Benadryl, as previously noted, and prednisone at the  end of the case.  Because of the concerns, the patient will be admitted to  telemetry for overnight observation.  Blood cultures will be obtained and we  will watch her.                                               Duke Salvia, M.D. [facility]    SCK/MEDQ  D:  11/23/2002  T:  11/24/2002  Job:  [id number]   cc:   Sandra [name]

## 2011-01-24 NOTE — Assessment & Plan Note (Signed)
[facility]                         ELECTROPHYSIOLOGY OFFICE NOTE   NAME:[name], Sandra WHITMOYER                        MRN:          [id number]  DATE:12/23/2006                            DOB:          01-27-1934    [name] comes in.  She has had no intercurrent syncope.  She has  undergone arthroscopic knee surgery with Dr. Dannielle Huh, which has been  wildly successful.   Her loop recorder was interrogated and demonstrates no intercurrent  episodes.  It is now 43 months old and she would like to have it taken  out, if it shows nothing, so we will plan to see her again in about  three months.   I should note that her blood pressure was elevated today, at 158/58.  I  suggested she follow up with Dr. Sherral Hammers about this, as this has been  consistently elevated.  Her antihypertensive is Benicar/HCT 20/12.5 and  this might benefit from up-titration.   The rest of her exam was notable for clear lungs, regular heart sounds  and no edema.     Duke Salvia, MD, [facility]  Electronically Signed    SCK/MedQ  DD: 12/23/2006  DT: 12/23/2006  Job #: [id number]   cc:   Rosalita Levan [place] Dr. Sherlynn Stalls, [facility]

## 2011-01-24 NOTE — Op Note (Signed)
Sandra [name], Sandra [name]                 ACCOUNT NO.:  [id number]   MEDICAL RECORD NO.:  [id number]          PATIENT TYPE:  OIB   LOCATION:  2899                         FACILITY:  [facility]   PHYSICIAN:  Duke Salvia, M.D.  DATE OF BIRTH:  1933/10/18   DATE OF PROCEDURE:  01/27/2005  DATE OF DISCHARGE:                                 OPERATIVE REPORT   PREOPERATIVE DIAGNOSIS:  Recurrent syncope.   POSTOPERATIVE DIAGNOSIS:  Recurrent syncope.   PROCEDURE:  Loop explantation and loop reimplantation.   CARDIOLOGIST:  Duke Salvia, M.D.   DESCRIPTION OF PROCEDURE:  Following the obtaining of an informed consent,  the patient was brought to the electrophysiology laboratory and placed on  the fluoroscopy table in the supine position.  After a routine prep and  drape, lidocaine was infiltrated over the line of the previous incision and  carried down to the layer of the device pocket using sharp dissection.  The  pocket was opened.  The device was explanted.  At this point hemostasis was  assured and a new Medtronic implantable loop recorder, serial [id number]  was implanted.  The pocket was closed in three layers in the normal fashion.  The wound was washed and dried and a Benzoin and Steri-Strip dressing was  applied.  The needle count, sponge count and instrument count were correct  at the end of the procedure according to the staff.   The patient tolerated the procedure without apparent complication.      SCK/MEDQ  D:  01/27/2005  T:  01/27/2005  Job:  [id number]   cc:   [facility]   Electrophysiology Lab [facility]   Sherlynn Stalls, M.D.  [place]ud[place], [place]

## 2011-01-24 NOTE — Discharge Summary (Signed)
   Sandra [name], Sandra [name]                             ACCOUNT NO.:  [id number]   MEDICAL RECORD NO.:  [id number]                   PATIENT TYPE:  OIB   LOCATION:  4715                                 FACILITY:  [facility]   PHYSICIAN:  Duke Salvia, M.D. [facility]           DATE OF BIRTH:  1934/06/27   DATE OF ADMISSION:  11/23/2002  DATE OF DISCHARGE:  11/24/2002                                 DISCHARGE SUMMARY   PRIMARY DIAGNOSIS:  Syncope.   HISTORY OF PRESENT ILLNESS:  This is a 77 year old female, who was seen in  the office after recurrent episodes of syncope.  Past medical history  significant for hypertension.  She was admitted for placement of an internal  Loop recorder.  The patient underwent placement of an internal Loop  recorder.  The patient, postoperatively developed chills and erythema.  She  was treated with Benadryl and prednisone for a possible drug reaction.  She  was observed overnight.  The patient's rash eventually had dissipated by the  following morning.  She was then discharged to home in stable condition on  all of her previous medications.   PREVIOUS MEDICATIONS:  1. Toprol XL 12.5 daily.  2. Multivitamin daily.  3. Calor 10 daily.  4. Hygroton 25 daily.  5. Tylenol 1-2 tabs q.4-6h. p.r.n.   DISCHARGE INSTRUCTIONS:  1. No heavy lifting or strenuous activity for four days.  2. She was not to get her incision wet for one week.  3. Low-salt, low-cholesterol diet.  4. She was to call if she developed a lump or any drainage at her incision.  5. She was scheduled to see the [facility] at [facility], December 08, 2002,     at 9:15 a.m., and she was to see Duke Salvia, M.D. within three     months.     Chinita Pester, C.R.N.P. [facility]                 Duke Salvia, M.D. [facility]    DS/MEDQ  D:  11/24/2002  T:  11/25/2002  Job:  [id number]   cc:   Duke Salvia, M.D. [facility]   Pacemaker Clinic,

## 2011-04-07 ENCOUNTER — Encounter: Payer: Self-pay | Admitting: Cardiovascular Disease

## 2011-05-05 ENCOUNTER — Encounter: Payer: Self-pay | Admitting: Cardiovascular Disease

## 2011-05-05 ENCOUNTER — Ambulatory Visit (INDEPENDENT_AMBULATORY_CARE_PROVIDER_SITE_OTHER): Payer: Medicare Other | Admitting: Cardiovascular Disease

## 2011-05-05 DIAGNOSIS — R55 Syncope and collapse: Secondary | ICD-10-CM

## 2011-05-05 DIAGNOSIS — I059 Rheumatic mitral valve disease, unspecified: Secondary | ICD-10-CM

## 2011-05-05 DIAGNOSIS — I10 Essential (primary) hypertension: Secondary | ICD-10-CM | POA: Insufficient documentation

## 2011-05-05 DIAGNOSIS — R0989 Other specified symptoms and signs involving the circulatory and respiratory systems: Secondary | ICD-10-CM

## 2011-05-05 DIAGNOSIS — R0600 Dyspnea, unspecified: Secondary | ICD-10-CM

## 2011-05-05 DIAGNOSIS — I340 Nonrheumatic mitral (valve) insufficiency: Secondary | ICD-10-CM | POA: Insufficient documentation

## 2011-05-05 NOTE — Assessment & Plan Note (Signed)
I think it's important to rule out angina equivalent. Thus, we'll proceed with a pharmacologic nuclear stress test. The patient is not able to exercise on a treadmill and she is not steady on her feet.

## 2011-05-05 NOTE — Progress Notes (Signed)
HPI  This is a 77 year old female who is here today for a followup visit as well as evaluation of syncope. She was most recently seen in September of 2011. The patient has known history of recurrent syncope  That goes back many years. As a matter of fact she had a loop recorder placed in 2007 or 2008 which did not show any evidence of arrhythmia at that time. Her symptoms improved on their own. However, since October of last year she had 4 episodes of syncope. All of these episodes were sudden in not preceded by any other symptoms. They were not related to changing in position. One episode happen while she was sitting on the commode but there was no significant straining. Most recent episode was more than a month ago when she was playing with her grandkids. She was in a standing position and was trying to throw ball. She passed out briefly for a few seconds. There has been no seizure activities noted. She has not been having any chest pain. However, she has noticed increased exertional dyspnea recently. She has been having hard time pushing her trash container from the side of the road back to the house due to significant dyspnea.   Allergies  Allergen Reactions  . Augmentin   . Ciprofloxacin   . Codeine   . Iodinated Diagnostic Agents   . Metoprolol   . Penicillins   . Sulfa Antibiotics      No current outpatient prescriptions on file prior to visit.     Past Medical History  Diagnosis Date  . HTN (hypertension)   . Rheumatoid arthritis   . HLD (hyperlipidemia)   . Syncope     recurrent/unexplained. Had a loop recorder in 2008 without documented arrhythmia     Past Surgical History  Procedure Date  . Shoulder surgery      Family History  Problem Relation Age of Onset  . Coronary artery disease       History   Social History  . Marital Status: Widowed    Spouse Name: N/A    Number of Children: 1  . Years of Education: N/A   Occupational History  . Retired    Social  History Main Topics  . Smoking status: Never Smoker   . Smokeless tobacco: Not on file  . Alcohol Use: No  . Drug Use: No  . Sexually Active: Not on file   Other Topics Concern  . Not on file   Social History Narrative  . No narrative on file     PHYSICAL EXAM   BP 131/76  Pulse 96  Ht 5\' 2"  (1.575 m)  Wt 147 lb (66.679 kg)  BMI 26.89 kg/m2  SpO2 97%  Constitutional: She is oriented to person, place, and time. She appears well-developed and well-nourished. No distress.  HENT: No nasal discharge.  Head: Normocephalic and atraumatic.  Eyes: Pupils are equal, round, and reactive to light. Right eye exhibits no discharge. Left eye exhibits no discharge.  Neck: Normal range of motion. Neck supple. No JVD present. No thyromegaly present.  Cardiovascular: Normal rate, regular rhythm, normal heart sounds and intact distal pulses. Exam reveals no gallop and no friction rub.  No murmur heard.  Pulmonary/Chest: Effort normal and breath sounds normal. No stridor. No respiratory distress. She has no wheezes. She has no rales. She exhibits no tenderness.  Abdominal: Soft. Bowel sounds are normal. She exhibits no distension. There is no tenderness. There is no rebound and no guarding.  Musculoskeletal: Normal range of motion. She exhibits no edema and no tenderness.  Neurological: She is alert and oriented to person, place, and time. Coordination normal.  Skin: Skin is warm and dry. No rash noted. She is not diaphoretic. No erythema. No pallor.  Psychiatric: She has a normal mood and affect. Her behavior is normal. Judgment and thought content normal.    Had recent ECG was reviewed which showed normal sinus rhythm with nonspecific ST-T wave changes. Heart rate was 96 beats per minute. Normal PR and QT intervals.   ASSESSMENT AND PLAN

## 2011-05-05 NOTE — Assessment & Plan Note (Signed)
Blood pressure is reasonably controlled. 

## 2011-05-05 NOTE — Patient Instructions (Addendum)
Your physician recommends that you schedule a follow-up appointment in: after your test  Your physician has recommended that you wear an event monitor. Event monitors are medical devices that record the heart's electrical activity. Doctors most often [place] these monitors to diagnose arrhythmias. Arrhythmias are problems with the speed or rhythm of the heartbeat. The monitor is a small, portable device. You can wear one while you do your normal daily activities. This is usually used to diagnose what is causing palpitations/syncope (passing out).  Your physician has requested that you have a lexiscan myoview. For further information please visit [email or web address] Please follow instruction sheet, as given.  ARRIVE @ 8:45  05/07/11

## 2011-05-05 NOTE — Assessment & Plan Note (Signed)
This was mild to moderate by echocardiogram last year. This is unlikely to be causing any of her symptoms.

## 2011-05-05 NOTE — Assessment & Plan Note (Signed)
The patient is having increased episodes of syncope of unexplained etiology. His symptoms are not suggestive of an orthostatic etiology. She is definitely not orthostatic by physical exam today. The sudden onset of her syncope without any preceding symptoms are worrisome for possible cardiac arrhythmia although this workup has been negative in the past including a loop recorder. Her symptoms currently though are more frequent than before. Thus, I recommend a 14 days outpatient telemetry. She would also need ischemic cardiac evaluation given her significant worsening of exertional dyspnea. It is also possible that the patient might have idiopathic recurrent syncope.

## 2011-05-06 ENCOUNTER — Encounter: Payer: Self-pay | Admitting: Cardiovascular Disease

## 2011-05-06 NOTE — Telephone Encounter (Signed)
error 

## 2011-05-08 ENCOUNTER — Encounter: Payer: Self-pay | Admitting: Cardiovascular Disease

## 2011-05-09 ENCOUNTER — Encounter: Payer: Self-pay | Admitting: *Deleted

## 2011-05-19 ENCOUNTER — Encounter: Payer: Self-pay | Admitting: Cardiovascular Disease

## 2011-06-02 ENCOUNTER — Encounter: Payer: Self-pay | Admitting: Cardiovascular Disease

## 2011-06-02 ENCOUNTER — Ambulatory Visit (INDEPENDENT_AMBULATORY_CARE_PROVIDER_SITE_OTHER): Payer: Medicare Other | Admitting: Cardiovascular Disease

## 2011-06-02 VITALS — BP 174/86 | HR 83 | Ht 61.0 in | Wt 150.0 lb

## 2011-06-02 DIAGNOSIS — R55 Syncope and collapse: Secondary | ICD-10-CM

## 2011-06-02 NOTE — Assessment & Plan Note (Signed)
This is still of undetermined etiology at this time. The patient's syncopal episodes are not orthostatic by nature and had orthostatic vital signs were unremarkable during last visit. Her cardiac workup recently included a 14 day outpatient telemetry as well as a stress test. Both of them were unremarkable. I recommend a tilt table test.  If this comes back unremarkable the patient might need evaluation by neurology for a possible atypical seizure disorder. I will also refer the patient to see Dr. Graciela Husbands for a second opinion.

## 2011-06-02 NOTE — Progress Notes (Signed)
HPI  This is a 77 year old female who is here today for a followup visit. She is being evaluated for recurrent syncope of unclear etiology. The patient had this for many years but had increased frequency in the last year. She had 4 syncopal episode in the last year. The description of these episodes as described in my most recent note. The patient underwent evaluation with a 14 day outpatient telemetry which basically showed no evidence of significant arrhythmia. She also complained of increased exertional dyspnea and thus was evaluated by a nuclear stress test which showed no evidence of ischemia with normal ejection fraction. The patient and family seem to be very frustrated with this condition and the inability to reach a definitive diagnosis.  Allergies  Allergen Reactions  . Augmentin   . Ciprofloxacin   . Codeine   . Iodinated Diagnostic Agents   . Metoprolol   . Penicillins   . Sulfa Antibiotics      Current Outpatient Prescriptions on File Prior to Visit  Medication Sig Dispense Refill  . cholecalciferol (VITAMIN D) 1000 UNITS tablet Take 2,000 Units by mouth daily.        . fish oil-omega-3 fatty acids 1000 MG capsule Take 2 g by mouth daily.        . folic acid (FOLVITE) 1 MG tablet Take 1 mg by mouth daily.        Marland Kitchen losartan-hydrochlorothiazide (HYZAAR) 100-25 MG per tablet Take 1 tablet by mouth daily.        . methotrexate (RHEUMATREX) 2.5 MG tablet Take 10 mg by mouth once a week. Caution:Chemotherapy. Protect from light.       . Multiple Vitamin (MULTIVITAMIN) tablet Take 1 tablet by mouth daily.        . rizatriptan (MAXALT) 10 MG tablet Take 10 mg by mouth as needed. May repeat in 2 hours if needed       . traMADol (ULTRAM) 50 MG tablet Take 50 mg by mouth every 6 (six) hours as needed.           Past Medical History  Diagnosis Date  . Rheumatoid arthritis   . HLD (hyperlipidemia)   . Syncope     recurrent/unexplained. Had a loop recorder in 2008 without documented  arrhythmia  . Mitral regurgitation     mild to moderate  . HTN (hypertension)      Past Surgical History  Procedure Date  . Shoulder surgery      Family History  Problem Relation Age of Onset  . Coronary artery disease       History   Social History  . Marital Status: Widowed    Spouse Name: N/A    Number of Children: 1  . Years of Education: N/A   Occupational History  . Retired    Social History Main Topics  . Smoking status: Never Smoker   . Smokeless tobacco: Not on file  . Alcohol Use: No  . Drug Use: No  . Sexually Active: Not on file   Other Topics Concern  . Not on file   Social History Narrative  . No narrative on file      PHYSICAL EXAM   BP 174/86  Pulse 83  Ht 5\' 1"  (1.549 m)  Wt 150 lb (68.04 kg)  BMI 28.34 kg/m2  Constitutional: She is oriented to person, place, and time. She appears well-developed and well-nourished. No distress.  HENT: No nasal discharge.  Head: Normocephalic and atraumatic.  Eyes: Pupils are equal,  round, and reactive to light. Right eye exhibits no discharge. Left eye exhibits no discharge.  Neck: Normal range of motion. Neck supple. No JVD present. No thyromegaly present.  Cardiovascular: Normal rate, regular rhythm, normal heart sounds and intact distal pulses. Exam reveals no gallop and no friction rub.  No murmur heard.  Pulmonary/Chest: Effort normal and breath sounds normal. No stridor. No respiratory distress. She has no wheezes. She has no rales. She exhibits no tenderness.  Abdominal: Soft. Bowel sounds are normal. She exhibits no distension. There is no tenderness. There is no rebound and no guarding.  Musculoskeletal: Normal range of motion. She exhibits no edema and no tenderness.  Neurological: She is alert and oriented to person, place, and time. Coordination normal.  Skin: Skin is warm and dry. No rash noted. She is not diaphoretic. No erythema. No pallor.  Psychiatric: She has a normal mood and  affect. Her behavior is normal. Judgment and thought content normal.      ASSESSMENT AND PLAN

## 2011-06-02 NOTE — Patient Instructions (Signed)
Your physician recommends that you schedule a follow-up appointment in: with Dr Graciela Husbands for consult for tilt table study

## 2011-06-18 ENCOUNTER — Institutional Professional Consult (permissible substitution): Payer: Medicare Other | Admitting: Internal Medicine

## 2011-07-01 ENCOUNTER — Institutional Professional Consult (permissible substitution): Payer: Medicare Other | Admitting: Internal Medicine

## 2011-08-12 ENCOUNTER — Encounter: Payer: Self-pay | Admitting: Internal Medicine

## 2011-08-12 ENCOUNTER — Ambulatory Visit (INDEPENDENT_AMBULATORY_CARE_PROVIDER_SITE_OTHER): Payer: Medicare Other | Admitting: Internal Medicine

## 2011-08-12 VITALS — BP 135/67 | HR 78 | Ht 61.0 in | Wt 146.8 lb

## 2011-08-12 DIAGNOSIS — R55 Syncope and collapse: Secondary | ICD-10-CM

## 2011-08-12 NOTE — Patient Instructions (Signed)
We will see you back on an as needed basis.  

## 2011-08-12 NOTE — Assessment & Plan Note (Signed)
Pt hsa recurrent syncope in a structurally normal heart. The prolonged recovery suggests vasomotor episode, the abruptness suggest arrhyhtmia  I have suggested a loop recorder (again) and she is not interested  HUTilt is not likely to be all that useful and would defer

## 2011-08-12 NOTE — Progress Notes (Signed)
  HPI  Sandra [name] is a 77 y.o. female Seen at request of Dr Kennith Maes because of recurrent abrupt onset and offset syncope assoc with no warning, significnat injury and increasingly frequent over recent months  They started about 6 years aago  eval incl neg echo, neg 14 day monitor and ECG shows normal QRS  She prev had ILR which were removed without recurrent syncope  Mild stable DOE, no cp chronic edema no PND and chronic 2 pillows 2/2 sinus  Some tachy palps butno history of afib  Past Medical History  Diagnosis Date  . Rheumatoid arthritis   . HLD (hyperlipidemia)   . Syncope     recurrent/unexplained. Had a loop recorder in 2008 without documented arrhythmia  . Mitral regurgitation     mild to moderate  . HTN (hypertension)     Past Surgical History  Procedure Date  . Shoulder surgery     Current Outpatient Prescriptions  Medication Sig Dispense Refill  . amLODipine (NORVASC) 2.5 MG tablet Take 2.5 mg by mouth daily.        . cholecalciferol (VITAMIN D) 1000 UNITS tablet Take 2,000 Units by mouth daily.        . fish oil-omega-3 fatty acids 1000 MG capsule Take 2 g by mouth daily.        . folic acid (FOLVITE) 1 MG tablet Take 1 mg by mouth daily.        Marland Kitchen losartan-hydrochlorothiazide (HYZAAR) 100-25 MG per tablet Take 1 tablet by mouth daily.        . methotrexate (RHEUMATREX) 2.5 MG tablet Take 10 mg by mouth once a week. Caution:Chemotherapy. Protect from light.       . Multiple Vitamin (MULTIVITAMIN) tablet Take 1 tablet by mouth daily.        . rizatriptan (MAXALT) 10 MG tablet Take 10 mg by mouth as needed. May repeat in 2 hours if needed       . traMADol (ULTRAM) 50 MG tablet Take 50 mg by mouth every 6 (six) hours as needed.          Allergies  Allergen Reactions  . Augmentin   . Ciprofloxacin   . Codeine   . Iodinated Diagnostic Agents   . Metoprolol   . Penicillins   . Sulfa Antibiotics     Review of Systems negative except from HPI and PMH  Physical  Exam Well developed and well nourished in no acute distress HENT normal E scleral and icterus clear Neck Supple JVP fla; carotids brisk and full Clear to ausculation Regular rate and rhythm, no murmurs gallops or rub Soft with active bowel sounds No clubbing cyanosis 1+ Edema Alert and oriented, grossly normal motor and sensory function Skin Warm and Dry Carotid massage neg ecg from 9/12 narrow QRS and normal intervals  Assessment and  Plan

## 2015-01-02 ENCOUNTER — Telehealth: Payer: Self-pay

## 2015-01-02 ENCOUNTER — Ambulatory Visit: Payer: Self-pay | Admitting: Neurology

## 2015-01-02 NOTE — Telephone Encounter (Signed)
Spoke to sister Tammy. Our office will call to reschedule.

## 2015-01-03 ENCOUNTER — Encounter: Payer: Self-pay | Admitting: Neurology

## 2015-01-03 ENCOUNTER — Ambulatory Visit (INDEPENDENT_AMBULATORY_CARE_PROVIDER_SITE_OTHER): Payer: Medicare Other | Admitting: Neurology

## 2015-01-03 VITALS — BP 169/74 | HR 76 | Ht 62.0 in | Wt 136.2 lb

## 2015-01-03 DIAGNOSIS — R269 Unspecified abnormalities of gait and mobility: Secondary | ICD-10-CM | POA: Diagnosis not present

## 2015-01-03 DIAGNOSIS — R413 Other amnesia: Secondary | ICD-10-CM | POA: Diagnosis not present

## 2015-01-03 DIAGNOSIS — R4182 Altered mental status, unspecified: Secondary | ICD-10-CM | POA: Diagnosis not present

## 2015-01-03 DIAGNOSIS — E538 Deficiency of other specified B group vitamins: Secondary | ICD-10-CM

## 2015-01-03 HISTORY — DX: Other amnesia: R41.3

## 2015-01-03 HISTORY — DX: Unspecified abnormalities of gait and mobility: R26.9

## 2015-01-03 NOTE — Patient Instructions (Signed)
Confusion Confusion is the inability to think with your usual speed or clarity. Confusion may come on quickly or slowly over time. How quickly the confusion comes on depends on the cause. Confusion can be due to any number of causes. CAUSES   Concussion, head injury, or head trauma.  Seizures.  Stroke.  Fever.  Brain tumor.  Age related decreased brain function (dementia).  Heightened emotional states like rage or terror.  Mental illness in which the person loses the ability to determine what is real and what is not (hallucinations).  Infections such as a urinary tract infection (UTI).  Toxic effects from alcohol, drugs, or prescription medicines.  Dehydration and an imbalance of salts in the body (electrolytes).  Lack of sleep.  Low blood sugar (diabetes).  Low levels of oxygen from conditions such as chronic lung disorders.  Drug interactions or other medicine side effects.  Nutritional deficiencies, especially niacin, thiamine, vitamin C, or vitamin B.  Sudden drop in body temperature (hypothermia).  Change in routine, such as when traveling or hospitalized. SIGNS AND SYMPTOMS  People often describe their thinking as cloudy or unclear when they are confused. Confusion can also include feeling disoriented. That means you are unaware of where or who you are. You may also not know what the date or time is. If confused, you may also have difficulty paying attention, remembering, and making decisions. Some people also act aggressively when they are confused.  DIAGNOSIS  The medical evaluation of confusion may include:  Blood and urine tests.  X-rays.  Brain and nervous system tests.  Analyzing your brain waves (electroencephalogram or EEG).  Magnetic resonance imaging (MRI) of your head.  Computed tomography (CT) scan of your head.  Mental status tests in which your health care provider may ask many questions. Some of these questions may seem silly or strange,  but they are a very important test to help diagnose and treat confusion. TREATMENT  An admission to the hospital may not be needed, but a person with confusion should not be left alone. Stay with a family member or friend until the confusion clears. Avoid alcohol, pain relievers, or sedative drugs until you have fully recovered. Do not drive until directed by your health care provider. HOME CARE INSTRUCTIONS  What family and friends can do:  To find out if someone is confused, ask the person to state his or her name, age, and the date. If the person is unsure or answers incorrectly, he or she is confused.  Always introduce yourself, no matter how well the person knows you.  Often remind the person of his or her location.  Place a calendar and clock near the confused person.  Help the person with his or her medicines. You may want to use a pill box, an alarm as a reminder, or give the person each dose as prescribed.  Talk about current events and plans for the day.  Try to keep the environment calm, quiet, and peaceful.  Make sure the person keeps follow-up visits with his or her health care provider. PREVENTION  Ways to prevent confusion:  Avoid alcohol.  Eat a balanced diet.  Get enough sleep.  Take medicine only as directed by your health care provider.  Do not become isolated. Spend time with other people and make plans for your days.  Keep careful watch on your blood sugar levels if you are diabetic. SEEK IMMEDIATE MEDICAL CARE IF:   You develop severe headaches, repeated vomiting, seizures, blackouts, or   slurred speech.  There is increasing confusion, weakness, numbness, restlessness, or personality changes.  You develop a loss of balance, have marked dizziness, feel uncoordinated, or fall.  You have delusions, hallucinations, or develop severe anxiety.  Your family members think you need to be rechecked. Document Released: 10/02/2004 Document Revised: 01/09/2014  Document Reviewed: 09/30/2013 [employer] Patient Information 2015 [employer]. This information is not intended to replace advice given to you by your health care provider. Make sure you discuss any questions you have with your health care provider.  

## 2015-01-03 NOTE — Progress Notes (Signed)
Reason for visit: Altered mental status  Referring physician: Dr. Carmine Savoy is a 81 y.o. female  History of present illness:  Ms. Honeycutt is a 81 year old right-handed white female with a history of a mild memory disorder. Approximately 9 days ago, the patient was shopping for shoes, and she suddenly appeared to be confused. The store employee was able to contact the daughter to come pick her up. The patient has patchy recollections of events of that day. The patient appeared to have decreased cognitive functioning, and increased problems with gait instability. The patient has a chronic gait disorder that has been present for several years. The patient continued to have significant cognitive issues for several days, and she eventually went to the hospital for an evaluation. MRI of the brain was done and was unremarkable for an acute event, chronic lacunar infarcts were seen suggestive of small vessel disease.Marland Kitchen MRA of the neck was done, showing a dominant left vertebral artery, otherwise no large artery occlusions were seen. The patient has not yet returned to her baseline, but she clearly is improved from her initial mental status. The patient had blood work done at [facility], the results of this are not available to me. The patient herself does not report headache around the time of onset of symptoms, she does have a history of migraine. She reports no focal numbness or weakness of the extremities. She does have some back and left hip discomfort, she was placed on oxycodone, but this was started after the onset of the confusion. She fell and hit her head on 12/08/2014, but no falls since that time. She denies any change in control the bowels or the bladder. She is sent to this office for an evaluation.  Past Medical History  Diagnosis Date  . Rheumatoid arthritis(714.0)   . HLD (hyperlipidemia)   . Syncope     recurrent/unexplained. Had a loop recorder in 2008 without  documented arrhythmia  . Mitral regurgitation     mild to moderate  . HTN (hypertension)   . Hypertension   . GERD (gastroesophageal reflux disease)   . Headache   . Memory difficulties 01/03/2015  . Abnormality of gait 01/03/2015    Past Surgical History  Procedure Laterality Date  . Shoulder surgery    . Knee surgery Left 2010  . Abdominal hysterectomy    . Nasal sinus surgery      Family History  Problem Relation Age of Onset  . Coronary artery disease    . Hypertension Mother   . Stroke Mother   . Hypercholesterolemia Mother   . Hypertension Father   . Stroke Father   . Hypercholesterolemia Father   . Ovarian cancer Sister   . Dementia Sister   . Hypertension Brother   . Cancer Brother     melanoma-mets to liver  . Dementia Brother     Social history:  reports that she has never smoked. She has never used smokeless tobacco. She reports that she does not drink alcohol or use illicit drugs.  Medications:  Prior to Admission medications   Medication Sig Start Date End Date Taking? Authorizing Provider  aspirin 81 MG tablet Take 81 mg by mouth daily.   Yes Historical Provider, MD  cholecalciferol (VITAMIN D) 1000 UNITS tablet Take 2,000 Units by mouth daily.     Yes Historical Provider, MD  denosumab (PROLIA) 60 MG/ML SOLN injection Inject 60 mg into the skin every 6 (six) months. Administer in upper  arm, thigh, or abdomen   Yes Historical Provider, MD  oxyCODONE (OXY IR/ROXICODONE) 5 MG immediate release tablet Take 5 mg by mouth every 8 (eight) hours as needed for severe pain.   Yes Historical Provider, MD  rizatriptan (MAXALT) 10 MG tablet Take 10 mg by mouth as needed. May repeat in 2 hours if needed    Yes Historical Provider, MD  valsartan-hydrochlorothiazide (DIOVAN-HCT) 320-25 MG per tablet Take 1 tablet by mouth daily.   Yes Historical Provider, MD      Allergies  Allergen Reactions  . Amoxicillin-Pot Clavulanate   . Ciprofloxacin   . Codeine   .  Iodinated Diagnostic Agents   . Metoprolol   . Penicillins   . Sulfa Antibiotics     ROS:  Out of a complete 14 system review of symptoms, the patient complains only of the following symptoms, and all other reviewed systems are negative.  Hearing loss Snoring Joint pain Memory loss, confusion, dizziness Depression, anxiety, decreased energy   Blood pressure 169/74, pulse 76, height 5\' 2"  (1.575 m), weight 136 lb 3.2 oz (61.78 kg).  Physical Exam  General: The patient is alert and cooperative at the time of the examination.  Eyes: Pupils are equal, round, and reactive to light. Discs are flat bilaterally.  Neck: The neck is supple, no carotid bruits are noted.  Respiratory: The respiratory examination is clear.  Cardiovascular: The cardiovascular examination reveals a regular rate and rhythm, no obvious murmurs or rubs are noted.  Skin: Extremities are without significant edema.  Neurologic Exam  Mental status: The patient is alert and oriented x 2 at the time of examination (not to date). Mini-Mental Status Examination done today shows a total score 20/30.  Cranial nerves: Facial symmetry is present. There is good sensation of the face to pinprick and soft touch bilaterally. The strength of the facial muscles and the muscles to head turning and shoulder shrug are normal bilaterally. Speech is well enunciated, no aphasia or dysarthria is noted. Extraocular movements are full. Visual fields are full. The tongue is midline, and the patient has symmetric elevation of the soft palate. No obvious hearing deficits are noted.  Motor: The motor testing reveals 5 over 5 strength of all 4 extremities. Good symmetric motor tone is noted throughout.  Sensory: Sensory testing is intact to pinprick, soft touch, vibration sensation, and position sense on all 4 extremities. No evidence of extinction is noted.  Coordination: Cerebellar testing reveals good finger-nose-finger and heel-to-shin  bilaterally.  Gait and station: Gait is wide-based, unsteady. Patient has a tendency at times to lean backwards. Tandem gait was not tested. Romberg is negative, but slightly unsteady.  Reflexes: Deep tendon reflexes are symmetric and normal bilaterally. Toes are downgoing bilaterally.   Assessment/Plan:  1. Altered mental status  2. Gait disorder  The patient has had a sudden onset of an event of confusion, altered mental status. The patient has had some worsening of gait, but she has a pre-existing history of a chronic gait disorder. The patient will be set up for an EEG study, she will have blood work done today. We may consider physical therapy for her walking at some point. She will follow-up in 3 months. The etiology of the mental status changes not clear, there has been no evidence of an acute stroke, seizures and migraine need to be considered, we will do blood work to look for a metabolic etiology as well.  Jill Alexanders MD 01/03/2015 6:55 PM  [facility] [address] [place], [place] [address]  Phone [phone] Fax [phone]

## 2015-01-05 LAB — AMMONIA: AMMONIA: 29 ug/dL (ref 19–87)

## 2015-01-05 LAB — VITAMIN B12: Vitamin B-12: 344 pg/mL (ref 211–946)

## 2015-01-05 LAB — RPR: RPR Ser Ql: NONREACTIVE

## 2015-01-05 LAB — VITAMIN B1: Thiamine: 99.3 nmol/L (ref 66.5–200.0)

## 2015-01-05 LAB — COPPER, SERUM: COPPER: 134 ug/dL (ref 72–166)

## 2015-01-05 LAB — SEDIMENTATION RATE: SED RATE: 5 mm/h (ref 0–40)

## 2015-01-08 ENCOUNTER — Telehealth: Payer: Self-pay

## 2015-01-08 NOTE — Telephone Encounter (Signed)
-----   Message from Kathrynn Ducking, MD sent at 01/05/2015  5:30 PM EDT -----  The blood work results are unremarkable. Please call the patient.  ----- Message -----    From: [facility] Lab Results In Interface    Sent: 01/04/2015   7:49 AM      To: Kathrynn Ducking, MD

## 2015-01-08 NOTE — Telephone Encounter (Signed)
I called the patient. Relayed results.

## 2015-01-09 ENCOUNTER — Ambulatory Visit (INDEPENDENT_AMBULATORY_CARE_PROVIDER_SITE_OTHER): Payer: Medicare Other

## 2015-01-09 ENCOUNTER — Telehealth: Payer: Self-pay | Admitting: Neurology

## 2015-01-09 DIAGNOSIS — R269 Unspecified abnormalities of gait and mobility: Secondary | ICD-10-CM

## 2015-01-09 DIAGNOSIS — R4182 Altered mental status, unspecified: Secondary | ICD-10-CM | POA: Diagnosis not present

## 2015-01-09 DIAGNOSIS — R413 Other amnesia: Secondary | ICD-10-CM

## 2015-01-09 NOTE — Telephone Encounter (Signed)
I called the patient. The EEG study was unremarkable. Blood work was also normal. The patient will let us know if she has another confusional event.

## 2015-01-09 NOTE — Procedures (Signed)
    History:  Sandra [name] is a 81 year old patient with a history of a memory disorder, who had an episode of sudden onset confusion in mid April 2016 while shopping. The patient has patchy recollection of events that occurred that day. She is being evaluated for this episode.  This is a routine EEG. No skull defects are noted. Medications include aspirin, vitamin D, Prolia, oxycodone, Maxalt, and Diovan.   EEG classification: Normal awake  Description of the recording: The background rhythms of this recording consists of a fairly well modulated medium amplitude alpha rhythm of 8 Hz that is reactive to eye opening and closure. As the record progresses, the patient appears to remain in the waking state throughout the recording. Photic stimulation was performed, resulting in a bilateral and symmetric photic driving response. Hyperventilation was also performed, resulting in a minimal buildup of the background rhythm activities without significant slowing seen. At no time during the recording does there appear to be evidence of spike or spike wave discharges or evidence of focal slowing. EKG monitor shows no evidence of cardiac rhythm abnormalities with a heart rate of 78.  Impression: This is a normal EEG recording in the waking state. No evidence of ictal or interictal discharges are seen.

## 2015-05-16 ENCOUNTER — Ambulatory Visit (INDEPENDENT_AMBULATORY_CARE_PROVIDER_SITE_OTHER): Payer: Medicare Other | Admitting: Neurology

## 2015-05-16 ENCOUNTER — Encounter: Payer: Self-pay | Admitting: Neurology

## 2015-05-16 VITALS — BP 170/80 | HR 76 | Ht 62.0 in | Wt 142.5 lb

## 2015-05-16 DIAGNOSIS — R4182 Altered mental status, unspecified: Secondary | ICD-10-CM

## 2015-05-16 DIAGNOSIS — M6281 Muscle weakness (generalized): Secondary | ICD-10-CM

## 2015-05-16 DIAGNOSIS — R269 Unspecified abnormalities of gait and mobility: Secondary | ICD-10-CM | POA: Diagnosis not present

## 2015-05-16 DIAGNOSIS — R413 Other amnesia: Secondary | ICD-10-CM | POA: Diagnosis not present

## 2015-05-16 MED ORDER — TRAZODONE HCL 50 MG PO TABS: 50.0000 mg | ORAL_TABLET | Freq: Every day | ORAL | Status: DC

## 2015-05-16 NOTE — Patient Instructions (Addendum)
We will try trazodone at night for sleep to see if the cognitive functioning improves over time. Let me know if there needs to be a dose adjustment for the medication.  Fall Prevention and Home Safety Falls cause injuries and can affect all age groups. It is possible to use preventive measures to significantly decrease the likelihood of falls. There are many simple measures which can make your home safer and prevent falls. OUTDOORS  Repair cracks and edges of walkways and driveways.  Remove high doorway thresholds.  Trim shrubbery on the main path into your home.  Have good outside lighting.  Clear walkways of tools, rocks, debris, and clutter.  Check that handrails are not broken and are securely fastened. Both sides of steps should have handrails.  Have leaves, snow, and ice cleared regularly.  Use sand or salt on walkways during winter months.  In the garage, clean up grease or oil spills. BATHROOM  Install night lights.  Install grab bars by the toilet and in the tub and shower.  Use non-skid mats or decals in the tub or shower.  Place a plastic non-slip stool in the shower to sit on, if needed.  Keep floors dry and clean up all water on the floor immediately.  Remove soap buildup in the tub or shower on a regular basis.  Secure bath mats with non-slip, double-sided rug tape.  Remove throw rugs and tripping hazards from the floors. BEDROOMS  Install night lights.  Make sure a bedside light is easy to reach.  Do not use oversized bedding.  Keep a telephone by your bedside.  Have a firm chair with side arms to use for getting dressed.  Remove throw rugs and tripping hazards from the floor. KITCHEN  Keep handles on pots and pans turned toward the center of the stove. Use back burners when possible.  Clean up spills quickly and allow time for drying.  Avoid walking on wet floors.  Avoid hot utensils and knives.  Position shelves so they are not too  high or low.  Place commonly used objects within easy reach.  If necessary, use a sturdy step stool with a grab bar when reaching.  Keep electrical cables out of the way.  Do not use floor polish or wax that makes floors slippery. If you must use wax, use non-skid floor wax.  Remove throw rugs and tripping hazards from the floor. STAIRWAYS  Never leave objects on stairs.  Place handrails on both sides of stairways and use them. Fix any loose handrails. Make sure handrails on both sides of the stairways are as long as the stairs.  Check carpeting to make sure it is firmly attached along stairs. Make repairs to worn or loose carpet promptly.  Avoid placing throw rugs at the top or bottom of stairways, or properly secure the rug with carpet tape to prevent slippage. Get rid of throw rugs, if possible.  Have an electrician put in a light switch at the top and bottom of the stairs. OTHER FALL PREVENTION TIPS  Wear low-heel or rubber-soled shoes that are supportive and fit well. Wear closed toe shoes.  When using a stepladder, make sure it is fully opened and both spreaders are firmly locked. Do not climb a closed stepladder.  Add color or contrast paint or tape to grab bars and handrails in your home. Place contrasting color strips on first and last steps.  Learn and use mobility aids as needed. Install an electrical emergency response system.  Turn on lights to avoid dark areas. Replace light bulbs that burn out immediately. Get light switches that glow.  Arrange furniture to create clear pathways. Keep furniture in the same place.  Firmly attach carpet with non-skid or double-sided tape.  Eliminate uneven floor surfaces.  Select a carpet pattern that does not visually hide the edge of steps.  Be aware of all pets. OTHER HOME SAFETY TIPS  Set the water temperature for 120 F (48.8 C).  Keep emergency numbers on or near the telephone.  Keep smoke detectors on every level  of the home and near sleeping areas. Document Released: 08/15/2002 Document Revised: 02/24/2012 Document Reviewed: 11/14/2011 [facility] Patient Information 2015 [place], [place]. This information is not intended to replace advice given to you by your health care provider. Make sure you discuss any questions you have with your health care provider.

## 2015-05-16 NOTE — Progress Notes (Signed)
Reason for visit: Altered mental status  Sandra [name] is an 81 y.o. female  History of present illness:  Sandra [name] is a 81 year old right-handed white female with a history of some issues with altered mental status, and confusion. The patient continues to have problems with feeling "spacey" since April 2016. MRI of the brain did not show acute cerebrovascular disease, an EEG study was unremarkable. The patient does have a history of migraine headache, she will have headaches off and on, but the headaches are relatively mild. She has had some occasional events where she is unable to read a newspaper. She goes on to say that she is having a lot of problems with insomnia. She will not function well the night after she does not sleep well, she does better if she sleeps at night, and she will function cognitively more effectively the next day. She therefore has good and bad days. She has not had any blackout episodes. She returns to this office for an evaluation. She does not clearly correlate the confusion events with headache. The patient reports that she is somewhat weaker, having increasing difficulty getting out of the bathtub. She is on Crestor currently, she denies any muscle aches or pains. She does report intermittent episodes of ringing in the ears, she describes this as a buzzing.  Past Medical History  Diagnosis Date  . Rheumatoid arthritis(714.0)   . HLD (hyperlipidemia)   . Syncope     recurrent/unexplained. Had a loop recorder in 2008 without documented arrhythmia  . Mitral regurgitation     mild to moderate  . HTN (hypertension)   . Hypertension   . GERD (gastroesophageal reflux disease)   . Headache   . Memory difficulties 01/03/2015  . Abnormality of gait 01/03/2015    Past Surgical History  Procedure Laterality Date  . Shoulder surgery    . Knee surgery Left 2010  . Abdominal hysterectomy    . Nasal sinus surgery      Family History  Problem Relation Age of Onset    . Coronary artery disease    . Hypertension Mother   . Stroke Mother   . Hypercholesterolemia Mother   . Hypertension Father   . Stroke Father   . Hypercholesterolemia Father   . Ovarian cancer Sister   . Dementia Sister   . Hypertension Brother   . Cancer Brother     melanoma-mets to liver  . Dementia Brother     Social history:  reports that she has never smoked. She has never used smokeless tobacco. She reports that she does not drink alcohol or use illicit drugs.    Allergies  Allergen Reactions  . Amoxicillin-Pot Clavulanate   . Ciprofloxacin   . Codeine   . Iodinated Diagnostic Agents   . Metoprolol   . Penicillins   . Sulfa Antibiotics     Medications:  Prior to Admission medications   Medication Sig Start Date End Date Taking? Authorizing Provider  aspirin 81 MG tablet Take 81 mg by mouth daily.   Yes Historical Provider, MD  cholecalciferol (VITAMIN D) 1000 UNITS tablet Take 2,000 Units by mouth daily.     Yes Historical Provider, MD  denosumab (PROLIA) 60 MG/ML SOLN injection Inject 60 mg into the skin every 6 (six) months. Administer in upper arm, thigh, or abdomen   Yes Historical Provider, MD  oxyCODONE (OXY IR/ROXICODONE) 5 MG immediate release tablet Take 5 mg by mouth every 8 (eight) hours as needed for severe  pain.   Yes Historical Provider, MD  rosuvastatin (CRESTOR) 5 MG tablet Take 5 mg by mouth daily.   Yes Historical Provider, MD  valsartan-hydrochlorothiazide (DIOVAN-HCT) 320-25 MG per tablet Take 1 tablet by mouth daily.   Yes Historical Provider, MD  traZODone (DESYREL) 50 MG tablet Take 1 tablet (50 mg total) by mouth at bedtime. 05/16/15   Kathrynn Ducking, MD    ROS:  Out of a complete 14 system review of symptoms, the patient complains only of the following symptoms, and all other reviewed systems are negative.  Decreased activity, fatigue Hearing loss Eye redness, light sensitivity Leg swelling Restless legs, insomnia, daytime sleepiness,  snoring Joint pain, back pain, muscle cramps, walking difficulty, neck stiffness Memory loss, dizziness, headache Agitation, confusion, decreased concentration, depression, anxiety  Blood pressure 170/80, pulse 76, height 5\' 2"  (1.575 m), weight 142 lb 8 oz (64.638 kg).  Physical Exam  General: The patient is alert and cooperative at the time of the examination.  Skin: No significant peripheral edema is noted.   Neurologic Exam  Mental status: The patient is alert and oriented x 3 at the time of the examination. The Mini-Mental Status Examination done today shows a total score 22/30..   Cranial nerves: Facial symmetry is present. Speech is normal, no aphasia or dysarthria is noted. Extraocular movements are full. Visual fields are full.  Motor: The patient has good strength in all 4 extremities.  Sensory examination: Soft touch sensation is symmetric on the face, arms, and legs.  Coordination: The patient has good finger-nose-finger and heel-to-shin bilaterally.  Gait and station: The patient has a slightly wide-based, unsteady gait. The patient uses a cane for ambulation. Tandem gait was not attempted. Romberg is negative. No drift is seen.  Reflexes: Deep tendon reflexes are symmetric.   Assessment/Plan:  1. Chronic insomnia  2. Mild confusion, memory disorder  3. Gait disorder  4. Reported muscular weakness  The patient will be sent for a CK level today to ensure that the Crestor is not affecting the muscles. The patient will be given a prescription for trazodone for sleep, she seems to have poor cognitive functioning after episodes of insomnia. The patient will follow-up in 3-4 months. The trazodone dosing can be adjusted accordingly. The patient does have a baseline mild memory disorder.  Jill Alexanders MD 05/16/2015 8:00 PM  [facility] [address] [place], [place] [address]  Phone [phone] Fax [phone]

## 2015-05-17 ENCOUNTER — Telehealth: Payer: Self-pay

## 2015-05-17 LAB — CK: Total CK: 110 U/L (ref 24–173)

## 2015-05-17 NOTE — Telephone Encounter (Signed)
I called the patient and relayed results. 

## 2015-05-17 NOTE — Telephone Encounter (Signed)
-----   Message from Kathrynn Ducking, MD sent at 05/17/2015  8:07 AM EDT -----  The blood work results are unremarkable. Please call the patient.  ----- Message -----    From: [facility] Lab Results In Interface    Sent: 05/17/2015   5:41 AM      To: Kathrynn Ducking, MD

## 2015-06-11 ENCOUNTER — Other Ambulatory Visit: Payer: Self-pay | Admitting: Neurology

## 2015-09-26 DIAGNOSIS — M5136 Other intervertebral disc degeneration, lumbar region: Secondary | ICD-10-CM | POA: Diagnosis not present

## 2015-10-02 DIAGNOSIS — F329 Major depressive disorder, single episode, unspecified: Secondary | ICD-10-CM | POA: Diagnosis not present

## 2015-10-02 DIAGNOSIS — J302 Other seasonal allergic rhinitis: Secondary | ICD-10-CM | POA: Diagnosis not present

## 2015-10-02 DIAGNOSIS — G4700 Insomnia, unspecified: Secondary | ICD-10-CM | POA: Diagnosis not present

## 2015-10-02 DIAGNOSIS — G8929 Other chronic pain: Secondary | ICD-10-CM | POA: Diagnosis not present

## 2015-10-02 DIAGNOSIS — M545 Low back pain: Secondary | ICD-10-CM | POA: Diagnosis not present

## 2015-10-09 ENCOUNTER — Ambulatory Visit: Payer: Medicare Other | Admitting: Neurology

## 2015-10-15 DIAGNOSIS — M5136 Other intervertebral disc degeneration, lumbar region: Secondary | ICD-10-CM | POA: Diagnosis not present

## 2015-10-15 DIAGNOSIS — M6281 Muscle weakness (generalized): Secondary | ICD-10-CM | POA: Diagnosis not present

## 2015-10-29 DIAGNOSIS — M479 Spondylosis, unspecified: Secondary | ICD-10-CM | POA: Diagnosis not present

## 2015-10-29 DIAGNOSIS — G309 Alzheimer's disease, unspecified: Secondary | ICD-10-CM | POA: Diagnosis not present

## 2015-10-29 DIAGNOSIS — G894 Chronic pain syndrome: Secondary | ICD-10-CM | POA: Diagnosis not present

## 2015-10-29 DIAGNOSIS — F329 Major depressive disorder, single episode, unspecified: Secondary | ICD-10-CM | POA: Diagnosis not present

## 2015-10-29 DIAGNOSIS — M5090 Cervical disc disorder, unspecified, unspecified cervical region: Secondary | ICD-10-CM | POA: Diagnosis not present

## 2015-10-29 DIAGNOSIS — M47816 Spondylosis without myelopathy or radiculopathy, lumbar region: Secondary | ICD-10-CM | POA: Diagnosis not present

## 2015-11-07 DIAGNOSIS — M5136 Other intervertebral disc degeneration, lumbar region: Secondary | ICD-10-CM | POA: Diagnosis not present

## 2015-11-07 DIAGNOSIS — M6281 Muscle weakness (generalized): Secondary | ICD-10-CM | POA: Diagnosis not present

## 2015-11-26 DIAGNOSIS — G894 Chronic pain syndrome: Secondary | ICD-10-CM | POA: Diagnosis not present

## 2015-11-26 DIAGNOSIS — G309 Alzheimer's disease, unspecified: Secondary | ICD-10-CM | POA: Diagnosis not present

## 2015-11-26 DIAGNOSIS — M479 Spondylosis, unspecified: Secondary | ICD-10-CM | POA: Diagnosis not present

## 2015-11-26 DIAGNOSIS — F329 Major depressive disorder, single episode, unspecified: Secondary | ICD-10-CM | POA: Diagnosis not present

## 2015-11-26 DIAGNOSIS — M5090 Cervical disc disorder, unspecified, unspecified cervical region: Secondary | ICD-10-CM | POA: Diagnosis not present

## 2015-11-26 DIAGNOSIS — M47816 Spondylosis without myelopathy or radiculopathy, lumbar region: Secondary | ICD-10-CM | POA: Diagnosis not present

## 2015-12-10 DIAGNOSIS — M5136 Other intervertebral disc degeneration, lumbar region: Secondary | ICD-10-CM | POA: Diagnosis not present

## 2015-12-10 DIAGNOSIS — M6281 Muscle weakness (generalized): Secondary | ICD-10-CM | POA: Diagnosis not present

## 2015-12-19 DIAGNOSIS — G894 Chronic pain syndrome: Secondary | ICD-10-CM | POA: Diagnosis not present

## 2015-12-19 DIAGNOSIS — G309 Alzheimer's disease, unspecified: Secondary | ICD-10-CM | POA: Diagnosis not present

## 2015-12-19 DIAGNOSIS — F329 Major depressive disorder, single episode, unspecified: Secondary | ICD-10-CM | POA: Diagnosis not present

## 2015-12-19 DIAGNOSIS — M5090 Cervical disc disorder, unspecified, unspecified cervical region: Secondary | ICD-10-CM | POA: Diagnosis not present

## 2015-12-19 DIAGNOSIS — M47816 Spondylosis without myelopathy or radiculopathy, lumbar region: Secondary | ICD-10-CM | POA: Diagnosis not present

## 2016-01-04 DIAGNOSIS — M479 Spondylosis, unspecified: Secondary | ICD-10-CM | POA: Diagnosis not present

## 2016-01-04 DIAGNOSIS — G309 Alzheimer's disease, unspecified: Secondary | ICD-10-CM | POA: Diagnosis not present

## 2016-01-04 DIAGNOSIS — G894 Chronic pain syndrome: Secondary | ICD-10-CM | POA: Diagnosis not present

## 2016-01-04 DIAGNOSIS — M5090 Cervical disc disorder, unspecified, unspecified cervical region: Secondary | ICD-10-CM | POA: Diagnosis not present

## 2016-01-04 DIAGNOSIS — F329 Major depressive disorder, single episode, unspecified: Secondary | ICD-10-CM | POA: Diagnosis not present

## 2016-01-04 DIAGNOSIS — M47816 Spondylosis without myelopathy or radiculopathy, lumbar region: Secondary | ICD-10-CM | POA: Diagnosis not present

## 2016-01-22 DIAGNOSIS — Z79899 Other long term (current) drug therapy: Secondary | ICD-10-CM | POA: Diagnosis not present

## 2016-01-22 DIAGNOSIS — K219 Gastro-esophageal reflux disease without esophagitis: Secondary | ICD-10-CM | POA: Diagnosis not present

## 2016-01-22 DIAGNOSIS — Z13820 Encounter for screening for osteoporosis: Secondary | ICD-10-CM | POA: Diagnosis not present

## 2016-01-22 DIAGNOSIS — R262 Difficulty in walking, not elsewhere classified: Secondary | ICD-10-CM | POA: Diagnosis not present

## 2016-01-22 DIAGNOSIS — E559 Vitamin D deficiency, unspecified: Secondary | ICD-10-CM | POA: Diagnosis not present

## 2016-01-22 DIAGNOSIS — E782 Mixed hyperlipidemia: Secondary | ICD-10-CM | POA: Diagnosis not present

## 2016-01-22 DIAGNOSIS — M8580 Other specified disorders of bone density and structure, unspecified site: Secondary | ICD-10-CM | POA: Diagnosis not present

## 2016-01-22 DIAGNOSIS — M069 Rheumatoid arthritis, unspecified: Secondary | ICD-10-CM | POA: Diagnosis not present

## 2016-01-22 DIAGNOSIS — M859 Disorder of bone density and structure, unspecified: Secondary | ICD-10-CM | POA: Diagnosis not present

## 2016-01-22 DIAGNOSIS — Z0000 Encounter for general adult medical examination without abnormal findings: Secondary | ICD-10-CM | POA: Diagnosis not present

## 2016-01-22 DIAGNOSIS — Z1389 Encounter for screening for other disorder: Secondary | ICD-10-CM | POA: Diagnosis not present

## 2016-01-22 DIAGNOSIS — Z8673 Personal history of transient ischemic attack (TIA), and cerebral infarction without residual deficits: Secondary | ICD-10-CM | POA: Diagnosis not present

## 2016-01-22 DIAGNOSIS — N390 Urinary tract infection, site not specified: Secondary | ICD-10-CM | POA: Diagnosis not present

## 2016-01-22 DIAGNOSIS — N183 Chronic kidney disease, stage 3 (moderate): Secondary | ICD-10-CM | POA: Diagnosis not present

## 2016-01-23 DIAGNOSIS — N390 Urinary tract infection, site not specified: Secondary | ICD-10-CM | POA: Diagnosis not present

## 2016-01-23 DIAGNOSIS — R8299 Other abnormal findings in urine: Secondary | ICD-10-CM | POA: Diagnosis not present

## 2016-02-13 DIAGNOSIS — I10 Essential (primary) hypertension: Secondary | ICD-10-CM | POA: Diagnosis not present

## 2016-02-13 DIAGNOSIS — F0390 Unspecified dementia without behavioral disturbance: Secondary | ICD-10-CM | POA: Diagnosis not present

## 2016-02-13 DIAGNOSIS — E782 Mixed hyperlipidemia: Secondary | ICD-10-CM | POA: Diagnosis not present

## 2016-02-13 DIAGNOSIS — F418 Other specified anxiety disorders: Secondary | ICD-10-CM | POA: Diagnosis not present

## 2016-02-13 DIAGNOSIS — J302 Other seasonal allergic rhinitis: Secondary | ICD-10-CM | POA: Diagnosis not present

## 2016-02-25 DIAGNOSIS — M25551 Pain in right hip: Secondary | ICD-10-CM | POA: Diagnosis not present

## 2016-02-25 DIAGNOSIS — R2681 Unsteadiness on feet: Secondary | ICD-10-CM | POA: Diagnosis not present

## 2016-02-27 DIAGNOSIS — M47816 Spondylosis without myelopathy or radiculopathy, lumbar region: Secondary | ICD-10-CM | POA: Diagnosis not present

## 2016-02-27 DIAGNOSIS — Z1389 Encounter for screening for other disorder: Secondary | ICD-10-CM | POA: Diagnosis not present

## 2016-02-27 DIAGNOSIS — G894 Chronic pain syndrome: Secondary | ICD-10-CM | POA: Diagnosis not present

## 2016-02-27 DIAGNOSIS — M5090 Cervical disc disorder, unspecified, unspecified cervical region: Secondary | ICD-10-CM | POA: Diagnosis not present

## 2016-02-27 DIAGNOSIS — G309 Alzheimer's disease, unspecified: Secondary | ICD-10-CM | POA: Diagnosis not present

## 2016-02-27 DIAGNOSIS — F329 Major depressive disorder, single episode, unspecified: Secondary | ICD-10-CM | POA: Diagnosis not present

## 2016-04-15 DIAGNOSIS — M79644 Pain in right finger(s): Secondary | ICD-10-CM | POA: Diagnosis not present

## 2016-04-15 DIAGNOSIS — M79645 Pain in left finger(s): Secondary | ICD-10-CM | POA: Diagnosis not present

## 2016-04-15 DIAGNOSIS — M19041 Primary osteoarthritis, right hand: Secondary | ICD-10-CM | POA: Diagnosis not present

## 2016-04-15 DIAGNOSIS — M154 Erosive (osteo)arthritis: Secondary | ICD-10-CM | POA: Diagnosis not present

## 2016-04-15 DIAGNOSIS — M19042 Primary osteoarthritis, left hand: Secondary | ICD-10-CM | POA: Diagnosis not present

## 2016-06-18 DIAGNOSIS — S060X0A Concussion without loss of consciousness, initial encounter: Secondary | ICD-10-CM | POA: Diagnosis not present

## 2016-06-18 DIAGNOSIS — W19XXXA Unspecified fall, initial encounter: Secondary | ICD-10-CM | POA: Diagnosis not present

## 2016-06-18 DIAGNOSIS — R531 Weakness: Secondary | ICD-10-CM | POA: Diagnosis not present

## 2016-06-18 DIAGNOSIS — R4182 Altered mental status, unspecified: Secondary | ICD-10-CM | POA: Diagnosis not present

## 2016-06-18 DIAGNOSIS — N189 Chronic kidney disease, unspecified: Secondary | ICD-10-CM | POA: Diagnosis not present

## 2016-06-18 DIAGNOSIS — R42 Dizziness and giddiness: Secondary | ICD-10-CM | POA: Diagnosis not present

## 2016-07-03 DIAGNOSIS — Z23 Encounter for immunization: Secondary | ICD-10-CM | POA: Diagnosis not present

## 2016-08-16 DIAGNOSIS — R42 Dizziness and giddiness: Secondary | ICD-10-CM | POA: Diagnosis not present

## 2016-08-16 DIAGNOSIS — R51 Headache: Secondary | ICD-10-CM | POA: Diagnosis not present

## 2016-08-16 DIAGNOSIS — Z9181 History of falling: Secondary | ICD-10-CM | POA: Diagnosis not present

## 2016-08-17 DIAGNOSIS — R51 Headache: Secondary | ICD-10-CM | POA: Diagnosis not present

## 2016-08-21 DIAGNOSIS — M1711 Unilateral primary osteoarthritis, right knee: Secondary | ICD-10-CM | POA: Diagnosis not present

## 2016-08-21 DIAGNOSIS — M25561 Pain in right knee: Secondary | ICD-10-CM | POA: Diagnosis not present

## 2016-08-22 DIAGNOSIS — H5712 Ocular pain, left eye: Secondary | ICD-10-CM | POA: Diagnosis not present

## 2016-08-22 DIAGNOSIS — R2681 Unsteadiness on feet: Secondary | ICD-10-CM | POA: Diagnosis not present

## 2016-09-15 DIAGNOSIS — J0100 Acute maxillary sinusitis, unspecified: Secondary | ICD-10-CM | POA: Diagnosis not present

## 2016-09-15 DIAGNOSIS — J209 Acute bronchitis, unspecified: Secondary | ICD-10-CM | POA: Diagnosis not present

## 2016-09-15 DIAGNOSIS — R062 Wheezing: Secondary | ICD-10-CM | POA: Diagnosis not present

## 2016-10-10 DIAGNOSIS — M810 Age-related osteoporosis without current pathological fracture: Secondary | ICD-10-CM | POA: Diagnosis not present

## 2016-10-16 DIAGNOSIS — F0391 Unspecified dementia with behavioral disturbance: Secondary | ICD-10-CM | POA: Diagnosis not present

## 2016-10-16 DIAGNOSIS — Z9181 History of falling: Secondary | ICD-10-CM | POA: Diagnosis not present

## 2016-10-16 DIAGNOSIS — R262 Difficulty in walking, not elsewhere classified: Secondary | ICD-10-CM | POA: Diagnosis not present

## 2016-10-16 DIAGNOSIS — M545 Low back pain: Secondary | ICD-10-CM | POA: Diagnosis not present

## 2016-10-16 DIAGNOSIS — G8929 Other chronic pain: Secondary | ICD-10-CM | POA: Diagnosis not present

## 2016-10-16 DIAGNOSIS — M8580 Other specified disorders of bone density and structure, unspecified site: Secondary | ICD-10-CM | POA: Diagnosis not present

## 2016-10-16 DIAGNOSIS — J3089 Other allergic rhinitis: Secondary | ICD-10-CM | POA: Diagnosis not present

## 2016-10-16 DIAGNOSIS — K219 Gastro-esophageal reflux disease without esophagitis: Secondary | ICD-10-CM | POA: Diagnosis not present

## 2016-10-16 DIAGNOSIS — E782 Mixed hyperlipidemia: Secondary | ICD-10-CM | POA: Diagnosis not present

## 2016-10-16 DIAGNOSIS — I10 Essential (primary) hypertension: Secondary | ICD-10-CM | POA: Diagnosis not present

## 2017-01-05 DIAGNOSIS — M79644 Pain in right finger(s): Secondary | ICD-10-CM | POA: Diagnosis not present

## 2017-01-05 DIAGNOSIS — M79645 Pain in left finger(s): Secondary | ICD-10-CM | POA: Diagnosis not present

## 2017-01-30 DIAGNOSIS — G8929 Other chronic pain: Secondary | ICD-10-CM | POA: Diagnosis not present

## 2017-01-30 DIAGNOSIS — M159 Polyosteoarthritis, unspecified: Secondary | ICD-10-CM | POA: Diagnosis not present

## 2017-01-30 DIAGNOSIS — M545 Low back pain: Secondary | ICD-10-CM | POA: Diagnosis not present

## 2017-02-09 DIAGNOSIS — M159 Polyosteoarthritis, unspecified: Secondary | ICD-10-CM | POA: Diagnosis not present

## 2017-02-09 DIAGNOSIS — R531 Weakness: Secondary | ICD-10-CM | POA: Diagnosis not present

## 2017-02-09 DIAGNOSIS — R2681 Unsteadiness on feet: Secondary | ICD-10-CM | POA: Diagnosis not present

## 2017-02-27 DIAGNOSIS — F329 Major depressive disorder, single episode, unspecified: Secondary | ICD-10-CM | POA: Diagnosis not present

## 2017-02-27 DIAGNOSIS — M791 Myalgia: Secondary | ICD-10-CM | POA: Diagnosis not present

## 2017-02-27 DIAGNOSIS — M5090 Cervical disc disorder, unspecified, unspecified cervical region: Secondary | ICD-10-CM | POA: Diagnosis not present

## 2017-02-27 DIAGNOSIS — G894 Chronic pain syndrome: Secondary | ICD-10-CM | POA: Diagnosis not present

## 2017-02-27 DIAGNOSIS — G309 Alzheimer's disease, unspecified: Secondary | ICD-10-CM | POA: Diagnosis not present

## 2017-02-27 DIAGNOSIS — M47816 Spondylosis without myelopathy or radiculopathy, lumbar region: Secondary | ICD-10-CM | POA: Diagnosis not present

## 2017-03-13 DIAGNOSIS — R531 Weakness: Secondary | ICD-10-CM | POA: Diagnosis not present

## 2017-03-13 DIAGNOSIS — M159 Polyosteoarthritis, unspecified: Secondary | ICD-10-CM | POA: Diagnosis not present

## 2017-03-13 DIAGNOSIS — R2681 Unsteadiness on feet: Secondary | ICD-10-CM | POA: Diagnosis not present

## 2017-03-13 DIAGNOSIS — R262 Difficulty in walking, not elsewhere classified: Secondary | ICD-10-CM | POA: Diagnosis not present

## 2017-03-25 DIAGNOSIS — F329 Major depressive disorder, single episode, unspecified: Secondary | ICD-10-CM | POA: Diagnosis not present

## 2017-03-25 DIAGNOSIS — M791 Myalgia: Secondary | ICD-10-CM | POA: Diagnosis not present

## 2017-03-25 DIAGNOSIS — G894 Chronic pain syndrome: Secondary | ICD-10-CM | POA: Diagnosis not present

## 2017-03-25 DIAGNOSIS — G309 Alzheimer's disease, unspecified: Secondary | ICD-10-CM | POA: Diagnosis not present

## 2017-03-25 DIAGNOSIS — M47816 Spondylosis without myelopathy or radiculopathy, lumbar region: Secondary | ICD-10-CM | POA: Diagnosis not present

## 2017-03-25 DIAGNOSIS — M5090 Cervical disc disorder, unspecified, unspecified cervical region: Secondary | ICD-10-CM | POA: Diagnosis not present

## 2017-04-10 DIAGNOSIS — M47814 Spondylosis without myelopathy or radiculopathy, thoracic region: Secondary | ICD-10-CM | POA: Diagnosis not present

## 2017-04-10 DIAGNOSIS — M546 Pain in thoracic spine: Secondary | ICD-10-CM | POA: Diagnosis not present

## 2017-04-10 DIAGNOSIS — M5090 Cervical disc disorder, unspecified, unspecified cervical region: Secondary | ICD-10-CM | POA: Diagnosis not present

## 2017-04-10 DIAGNOSIS — G309 Alzheimer's disease, unspecified: Secondary | ICD-10-CM | POA: Diagnosis not present

## 2017-04-10 DIAGNOSIS — M791 Myalgia: Secondary | ICD-10-CM | POA: Diagnosis not present

## 2017-04-10 DIAGNOSIS — F329 Major depressive disorder, single episode, unspecified: Secondary | ICD-10-CM | POA: Diagnosis not present

## 2017-04-10 DIAGNOSIS — M5134 Other intervertebral disc degeneration, thoracic region: Secondary | ICD-10-CM | POA: Diagnosis not present

## 2017-04-10 DIAGNOSIS — M47816 Spondylosis without myelopathy or radiculopathy, lumbar region: Secondary | ICD-10-CM | POA: Diagnosis not present

## 2017-04-10 DIAGNOSIS — G894 Chronic pain syndrome: Secondary | ICD-10-CM | POA: Diagnosis not present

## 2017-04-13 DIAGNOSIS — Z961 Presence of intraocular lens: Secondary | ICD-10-CM | POA: Diagnosis not present

## 2017-04-13 DIAGNOSIS — H26493 Other secondary cataract, bilateral: Secondary | ICD-10-CM | POA: Diagnosis not present

## 2017-04-13 DIAGNOSIS — H35373 Puckering of macula, bilateral: Secondary | ICD-10-CM | POA: Diagnosis not present

## 2017-04-13 DIAGNOSIS — H353131 Nonexudative age-related macular degeneration, bilateral, early dry stage: Secondary | ICD-10-CM | POA: Diagnosis not present

## 2017-04-17 DIAGNOSIS — M159 Polyosteoarthritis, unspecified: Secondary | ICD-10-CM | POA: Diagnosis not present

## 2017-04-17 DIAGNOSIS — M546 Pain in thoracic spine: Secondary | ICD-10-CM | POA: Diagnosis not present

## 2017-04-22 DIAGNOSIS — M47816 Spondylosis without myelopathy or radiculopathy, lumbar region: Secondary | ICD-10-CM | POA: Diagnosis not present

## 2017-04-22 DIAGNOSIS — M791 Myalgia: Secondary | ICD-10-CM | POA: Diagnosis not present

## 2017-04-22 DIAGNOSIS — M5090 Cervical disc disorder, unspecified, unspecified cervical region: Secondary | ICD-10-CM | POA: Diagnosis not present

## 2017-04-22 DIAGNOSIS — M5134 Other intervertebral disc degeneration, thoracic region: Secondary | ICD-10-CM | POA: Diagnosis not present

## 2017-04-22 DIAGNOSIS — G309 Alzheimer's disease, unspecified: Secondary | ICD-10-CM | POA: Diagnosis not present

## 2017-04-22 DIAGNOSIS — G894 Chronic pain syndrome: Secondary | ICD-10-CM | POA: Diagnosis not present

## 2017-04-22 DIAGNOSIS — M546 Pain in thoracic spine: Secondary | ICD-10-CM | POA: Diagnosis not present

## 2017-04-22 DIAGNOSIS — F329 Major depressive disorder, single episode, unspecified: Secondary | ICD-10-CM | POA: Diagnosis not present

## 2017-04-22 DIAGNOSIS — M47814 Spondylosis without myelopathy or radiculopathy, thoracic region: Secondary | ICD-10-CM | POA: Diagnosis not present

## 2017-04-27 DIAGNOSIS — F0390 Unspecified dementia without behavioral disturbance: Secondary | ICD-10-CM | POA: Diagnosis not present

## 2017-04-27 DIAGNOSIS — I10 Essential (primary) hypertension: Secondary | ICD-10-CM | POA: Diagnosis not present

## 2017-04-27 DIAGNOSIS — G2581 Restless legs syndrome: Secondary | ICD-10-CM | POA: Diagnosis not present

## 2017-04-27 DIAGNOSIS — E785 Hyperlipidemia, unspecified: Secondary | ICD-10-CM | POA: Diagnosis not present

## 2017-05-29 DIAGNOSIS — K529 Noninfective gastroenteritis and colitis, unspecified: Secondary | ICD-10-CM | POA: Diagnosis not present

## 2017-05-29 DIAGNOSIS — I10 Essential (primary) hypertension: Secondary | ICD-10-CM | POA: Diagnosis not present

## 2017-05-29 DIAGNOSIS — N3001 Acute cystitis with hematuria: Secondary | ICD-10-CM | POA: Diagnosis not present

## 2017-05-29 DIAGNOSIS — I951 Orthostatic hypotension: Secondary | ICD-10-CM | POA: Diagnosis not present

## 2017-06-03 DIAGNOSIS — R350 Frequency of micturition: Secondary | ICD-10-CM | POA: Diagnosis not present

## 2017-06-03 DIAGNOSIS — R300 Dysuria: Secondary | ICD-10-CM | POA: Diagnosis not present

## 2017-06-03 DIAGNOSIS — E860 Dehydration: Secondary | ICD-10-CM | POA: Diagnosis not present

## 2017-06-24 DIAGNOSIS — Z23 Encounter for immunization: Secondary | ICD-10-CM | POA: Diagnosis not present

## 2017-07-22 DIAGNOSIS — M5090 Cervical disc disorder, unspecified, unspecified cervical region: Secondary | ICD-10-CM | POA: Diagnosis not present

## 2017-07-22 DIAGNOSIS — G309 Alzheimer's disease, unspecified: Secondary | ICD-10-CM | POA: Diagnosis not present

## 2017-07-22 DIAGNOSIS — M47814 Spondylosis without myelopathy or radiculopathy, thoracic region: Secondary | ICD-10-CM | POA: Diagnosis not present

## 2017-07-22 DIAGNOSIS — G894 Chronic pain syndrome: Secondary | ICD-10-CM | POA: Diagnosis not present

## 2017-07-22 DIAGNOSIS — M546 Pain in thoracic spine: Secondary | ICD-10-CM | POA: Diagnosis not present

## 2017-07-22 DIAGNOSIS — F329 Major depressive disorder, single episode, unspecified: Secondary | ICD-10-CM | POA: Diagnosis not present

## 2017-07-22 DIAGNOSIS — M47816 Spondylosis without myelopathy or radiculopathy, lumbar region: Secondary | ICD-10-CM | POA: Diagnosis not present

## 2017-07-22 DIAGNOSIS — M5134 Other intervertebral disc degeneration, thoracic region: Secondary | ICD-10-CM | POA: Diagnosis not present

## 2017-08-26 DIAGNOSIS — F329 Major depressive disorder, single episode, unspecified: Secondary | ICD-10-CM | POA: Diagnosis not present

## 2017-08-26 DIAGNOSIS — M47816 Spondylosis without myelopathy or radiculopathy, lumbar region: Secondary | ICD-10-CM | POA: Diagnosis not present

## 2017-08-26 DIAGNOSIS — M546 Pain in thoracic spine: Secondary | ICD-10-CM | POA: Diagnosis not present

## 2017-08-26 DIAGNOSIS — M5134 Other intervertebral disc degeneration, thoracic region: Secondary | ICD-10-CM | POA: Diagnosis not present

## 2017-08-26 DIAGNOSIS — G309 Alzheimer's disease, unspecified: Secondary | ICD-10-CM | POA: Diagnosis not present

## 2017-08-26 DIAGNOSIS — M5090 Cervical disc disorder, unspecified, unspecified cervical region: Secondary | ICD-10-CM | POA: Diagnosis not present

## 2017-08-26 DIAGNOSIS — G894 Chronic pain syndrome: Secondary | ICD-10-CM | POA: Diagnosis not present

## 2017-08-26 DIAGNOSIS — M47814 Spondylosis without myelopathy or radiculopathy, thoracic region: Secondary | ICD-10-CM | POA: Diagnosis not present

## 2017-09-21 DIAGNOSIS — C44329 Squamous cell carcinoma of skin of other parts of face: Secondary | ICD-10-CM | POA: Diagnosis not present

## 2017-09-21 DIAGNOSIS — L814 Other melanin hyperpigmentation: Secondary | ICD-10-CM | POA: Diagnosis not present

## 2017-09-21 DIAGNOSIS — L578 Other skin changes due to chronic exposure to nonionizing radiation: Secondary | ICD-10-CM | POA: Diagnosis not present

## 2017-09-21 DIAGNOSIS — L821 Other seborrheic keratosis: Secondary | ICD-10-CM | POA: Diagnosis not present

## 2017-10-07 DIAGNOSIS — M546 Pain in thoracic spine: Secondary | ICD-10-CM | POA: Diagnosis not present

## 2017-10-07 DIAGNOSIS — M47814 Spondylosis without myelopathy or radiculopathy, thoracic region: Secondary | ICD-10-CM | POA: Diagnosis not present

## 2017-10-07 DIAGNOSIS — M47816 Spondylosis without myelopathy or radiculopathy, lumbar region: Secondary | ICD-10-CM | POA: Diagnosis not present

## 2017-10-07 DIAGNOSIS — M5134 Other intervertebral disc degeneration, thoracic region: Secondary | ICD-10-CM | POA: Diagnosis not present

## 2017-10-07 DIAGNOSIS — G894 Chronic pain syndrome: Secondary | ICD-10-CM | POA: Diagnosis not present

## 2017-10-07 DIAGNOSIS — G309 Alzheimer's disease, unspecified: Secondary | ICD-10-CM | POA: Diagnosis not present

## 2017-10-07 DIAGNOSIS — M5090 Cervical disc disorder, unspecified, unspecified cervical region: Secondary | ICD-10-CM | POA: Diagnosis not present

## 2017-10-07 DIAGNOSIS — F329 Major depressive disorder, single episode, unspecified: Secondary | ICD-10-CM | POA: Diagnosis not present

## 2017-10-23 DIAGNOSIS — M546 Pain in thoracic spine: Secondary | ICD-10-CM | POA: Diagnosis not present

## 2017-10-23 DIAGNOSIS — G309 Alzheimer's disease, unspecified: Secondary | ICD-10-CM | POA: Diagnosis not present

## 2017-10-23 DIAGNOSIS — M47814 Spondylosis without myelopathy or radiculopathy, thoracic region: Secondary | ICD-10-CM | POA: Diagnosis not present

## 2017-10-23 DIAGNOSIS — M47816 Spondylosis without myelopathy or radiculopathy, lumbar region: Secondary | ICD-10-CM | POA: Diagnosis not present

## 2017-10-23 DIAGNOSIS — F329 Major depressive disorder, single episode, unspecified: Secondary | ICD-10-CM | POA: Diagnosis not present

## 2017-10-23 DIAGNOSIS — M5090 Cervical disc disorder, unspecified, unspecified cervical region: Secondary | ICD-10-CM | POA: Diagnosis not present

## 2017-10-23 DIAGNOSIS — G894 Chronic pain syndrome: Secondary | ICD-10-CM | POA: Diagnosis not present

## 2017-10-23 DIAGNOSIS — M5134 Other intervertebral disc degeneration, thoracic region: Secondary | ICD-10-CM | POA: Diagnosis not present

## 2017-11-02 DIAGNOSIS — R5381 Other malaise: Secondary | ICD-10-CM | POA: Diagnosis not present

## 2017-11-02 DIAGNOSIS — M810 Age-related osteoporosis without current pathological fracture: Secondary | ICD-10-CM | POA: Diagnosis not present

## 2017-11-02 DIAGNOSIS — E782 Mixed hyperlipidemia: Secondary | ICD-10-CM | POA: Diagnosis not present

## 2017-11-02 DIAGNOSIS — E0590 Thyrotoxicosis, unspecified without thyrotoxic crisis or storm: Secondary | ICD-10-CM | POA: Diagnosis not present

## 2017-11-02 DIAGNOSIS — R5383 Other fatigue: Secondary | ICD-10-CM | POA: Diagnosis not present

## 2017-11-02 DIAGNOSIS — E559 Vitamin D deficiency, unspecified: Secondary | ICD-10-CM | POA: Diagnosis not present

## 2017-11-27 DIAGNOSIS — K921 Melena: Secondary | ICD-10-CM | POA: Diagnosis not present

## 2017-11-27 DIAGNOSIS — Z0000 Encounter for general adult medical examination without abnormal findings: Secondary | ICD-10-CM | POA: Diagnosis not present

## 2017-11-27 DIAGNOSIS — R7309 Other abnormal glucose: Secondary | ICD-10-CM | POA: Diagnosis not present

## 2017-11-27 DIAGNOSIS — J3089 Other allergic rhinitis: Secondary | ICD-10-CM | POA: Diagnosis not present

## 2017-12-02 DIAGNOSIS — K921 Melena: Secondary | ICD-10-CM | POA: Diagnosis not present

## 2017-12-02 DIAGNOSIS — R195 Other fecal abnormalities: Secondary | ICD-10-CM | POA: Diagnosis not present

## 2017-12-02 DIAGNOSIS — D62 Acute posthemorrhagic anemia: Secondary | ICD-10-CM | POA: Diagnosis not present

## 2017-12-04 DIAGNOSIS — R7309 Other abnormal glucose: Secondary | ICD-10-CM | POA: Diagnosis not present

## 2017-12-14 DIAGNOSIS — J329 Chronic sinusitis, unspecified: Secondary | ICD-10-CM | POA: Diagnosis not present

## 2017-12-14 DIAGNOSIS — J40 Bronchitis, not specified as acute or chronic: Secondary | ICD-10-CM | POA: Diagnosis not present

## 2017-12-14 DIAGNOSIS — D649 Anemia, unspecified: Secondary | ICD-10-CM | POA: Diagnosis not present

## 2017-12-25 DIAGNOSIS — J309 Allergic rhinitis, unspecified: Secondary | ICD-10-CM | POA: Diagnosis not present

## 2018-01-08 DIAGNOSIS — I10 Essential (primary) hypertension: Secondary | ICD-10-CM | POA: Diagnosis not present

## 2018-01-08 DIAGNOSIS — Z1331 Encounter for screening for depression: Secondary | ICD-10-CM | POA: Diagnosis not present

## 2018-01-08 DIAGNOSIS — G2581 Restless legs syndrome: Secondary | ICD-10-CM | POA: Diagnosis not present

## 2018-01-08 DIAGNOSIS — E559 Vitamin D deficiency, unspecified: Secondary | ICD-10-CM | POA: Diagnosis not present

## 2018-01-08 DIAGNOSIS — G4700 Insomnia, unspecified: Secondary | ICD-10-CM | POA: Diagnosis not present

## 2018-01-08 DIAGNOSIS — D649 Anemia, unspecified: Secondary | ICD-10-CM | POA: Diagnosis not present

## 2018-01-08 DIAGNOSIS — E785 Hyperlipidemia, unspecified: Secondary | ICD-10-CM | POA: Diagnosis not present

## 2018-01-08 DIAGNOSIS — Z139 Encounter for screening, unspecified: Secondary | ICD-10-CM | POA: Diagnosis not present

## 2018-01-08 DIAGNOSIS — E538 Deficiency of other specified B group vitamins: Secondary | ICD-10-CM | POA: Diagnosis not present

## 2018-01-08 DIAGNOSIS — Z6825 Body mass index (BMI) 25.0-25.9, adult: Secondary | ICD-10-CM | POA: Diagnosis not present

## 2018-01-08 DIAGNOSIS — G309 Alzheimer's disease, unspecified: Secondary | ICD-10-CM | POA: Diagnosis not present

## 2018-01-08 DIAGNOSIS — Z9181 History of falling: Secondary | ICD-10-CM | POA: Diagnosis not present

## 2018-03-03 DIAGNOSIS — J324 Chronic pansinusitis: Secondary | ICD-10-CM | POA: Diagnosis not present

## 2018-03-09 ENCOUNTER — Inpatient Hospital Stay (HOSPITAL_COMMUNITY): Admission: EM | Disposition: E | Payer: Self-pay | Source: Home / Self Care | Attending: Cardiovascular Disease

## 2018-03-09 DIAGNOSIS — I213 ST elevation (STEMI) myocardial infarction of unspecified site: Principal | ICD-10-CM | POA: Diagnosis present

## 2018-03-09 DIAGNOSIS — R0789 Other chest pain: Secondary | ICD-10-CM | POA: Diagnosis not present

## 2018-03-09 DIAGNOSIS — I083 Combined rheumatic disorders of mitral, aortic and tricuspid valves: Secondary | ICD-10-CM | POA: Diagnosis not present

## 2018-03-09 DIAGNOSIS — Z8249 Family history of ischemic heart disease and other diseases of the circulatory system: Secondary | ICD-10-CM

## 2018-03-09 DIAGNOSIS — Z823 Family history of stroke: Secondary | ICD-10-CM | POA: Diagnosis not present

## 2018-03-09 DIAGNOSIS — I959 Hypotension, unspecified: Secondary | ICD-10-CM | POA: Diagnosis present

## 2018-03-09 DIAGNOSIS — Z882 Allergy status to sulfonamides status: Secondary | ICD-10-CM

## 2018-03-09 DIAGNOSIS — Z7982 Long term (current) use of aspirin: Secondary | ICD-10-CM

## 2018-03-09 DIAGNOSIS — E7800 Pure hypercholesterolemia, unspecified: Secondary | ICD-10-CM | POA: Diagnosis present

## 2018-03-09 DIAGNOSIS — Z8041 Family history of malignant neoplasm of ovary: Secondary | ICD-10-CM | POA: Diagnosis not present

## 2018-03-09 DIAGNOSIS — Z880 Allergy status to penicillin: Secondary | ICD-10-CM

## 2018-03-09 DIAGNOSIS — Z888 Allergy status to other drugs, medicaments and biological substances status: Secondary | ICD-10-CM

## 2018-03-09 DIAGNOSIS — I5181 Takotsubo syndrome: Secondary | ICD-10-CM | POA: Diagnosis not present

## 2018-03-09 DIAGNOSIS — E785 Hyperlipidemia, unspecified: Secondary | ICD-10-CM

## 2018-03-09 DIAGNOSIS — I340 Nonrheumatic mitral (valve) insufficiency: Secondary | ICD-10-CM | POA: Diagnosis present

## 2018-03-09 DIAGNOSIS — I10 Essential (primary) hypertension: Secondary | ICD-10-CM | POA: Diagnosis present

## 2018-03-09 DIAGNOSIS — Z90710 Acquired absence of both cervix and uterus: Secondary | ICD-10-CM

## 2018-03-09 DIAGNOSIS — R079 Chest pain, unspecified: Secondary | ICD-10-CM | POA: Diagnosis not present

## 2018-03-09 DIAGNOSIS — Z881 Allergy status to other antibiotic agents status: Secondary | ICD-10-CM

## 2018-03-09 DIAGNOSIS — Z885 Allergy status to narcotic agent status: Secondary | ICD-10-CM | POA: Diagnosis not present

## 2018-03-09 DIAGNOSIS — F0390 Unspecified dementia without behavioral disturbance: Secondary | ICD-10-CM

## 2018-03-09 DIAGNOSIS — M069 Rheumatoid arthritis, unspecified: Secondary | ICD-10-CM | POA: Diagnosis not present

## 2018-03-09 DIAGNOSIS — Z91041 Radiographic dye allergy status: Secondary | ICD-10-CM

## 2018-03-09 DIAGNOSIS — K219 Gastro-esophageal reflux disease without esophagitis: Secondary | ICD-10-CM | POA: Diagnosis present

## 2018-03-09 DIAGNOSIS — I351 Nonrheumatic aortic (valve) insufficiency: Secondary | ICD-10-CM

## 2018-03-09 DIAGNOSIS — R531 Weakness: Secondary | ICD-10-CM | POA: Diagnosis not present

## 2018-03-09 HISTORY — PX: LEFT HEART CATH AND CORONARY ANGIOGRAPHY: CATH118249

## 2018-03-09 LAB — COMPREHENSIVE METABOLIC PANEL
ALBUMIN: 3.1 g/dL — AB (ref 3.5–5.0)
ALK PHOS: 45 U/L (ref 38–126)
ALT: 25 U/L (ref 0–44)
AST: 40 U/L (ref 15–41)
Anion gap: 9 (ref 5–15)
BUN: 21 mg/dL (ref 8–23)
CO2: 19 mmol/L — ABNORMAL LOW (ref 22–32)
Calcium: 10.2 mg/dL (ref 8.9–10.3)
Chloride: 104 mmol/L (ref 98–111)
Creatinine, Ser: 1.21 mg/dL — ABNORMAL HIGH (ref 0.44–1.00)
GFR calc Af Amer: 47 mL/min — ABNORMAL LOW (ref >60)
GFR calc non Af Amer: 41 mL/min — ABNORMAL LOW (ref >60)
GLUCOSE: 130 mg/dL — AB (ref 70–99)
POTASSIUM: 3.6 mmol/L (ref 3.5–5.1)
Sodium: 132 mmol/L — ABNORMAL LOW (ref 135–145)
TOTAL PROTEIN: 5.1 g/dL — AB (ref 6.5–8.1)
Total Bilirubin: 0.5 mg/dL (ref 0.3–1.2)

## 2018-03-09 LAB — POCT I-STAT, CHEM 8
BUN: 25 mg/dL — ABNORMAL HIGH (ref 8–23)
CALCIUM ION: 1.5 mmol/L — AB (ref 1.15–1.40)
Chloride: 99 mmol/L (ref 98–111)
Creatinine, Ser: 1.2 mg/dL — ABNORMAL HIGH (ref 0.44–1.00)
Glucose, Bld: 129 mg/dL — ABNORMAL HIGH (ref 70–99)
HCT: 28 % — ABNORMAL LOW (ref 36.0–46.0)
HEMOGLOBIN: 9.5 g/dL — AB (ref 12.0–15.0)
Potassium: 3.6 mmol/L (ref 3.5–5.1)
SODIUM: 133 mmol/L — AB (ref 135–145)
TCO2: 19 mmol/L — ABNORMAL LOW (ref 22–32)

## 2018-03-09 LAB — TROPONIN I
Troponin I: 3.74 ng/mL (ref <0.03)
Troponin I: 23.62 ng/mL (ref <0.03)

## 2018-03-09 LAB — POCT ACTIVATED CLOTTING TIME: ACTIVATED CLOTTING TIME: 346 s

## 2018-03-09 LAB — LIPID PANEL
CHOL/HDL RATIO: 3.7 ratio
Cholesterol: 117 mg/dL (ref 0–200)
HDL: 32 mg/dL — ABNORMAL LOW (ref >40)
LDL Cholesterol: 61 mg/dL (ref 0–99)
Triglycerides: 120 mg/dL (ref <150)
VLDL: 24 mg/dL (ref 0–40)

## 2018-03-09 LAB — PROTIME-INR
INR: 1.17
PROTHROMBIN TIME: 14.8 s (ref 11.4–15.2)

## 2018-03-09 LAB — HEMOGLOBIN A1C
HEMOGLOBIN A1C: 4.8 % (ref 4.8–5.6)
MEAN PLASMA GLUCOSE: 91.06 mg/dL

## 2018-03-09 LAB — CBC
HCT: 28.4 % — ABNORMAL LOW (ref 36.0–46.0)
HEMOGLOBIN: 9.8 g/dL — AB (ref 12.0–15.0)
MCH: 33.1 pg (ref 26.0–34.0)
MCHC: 34.5 g/dL (ref 30.0–36.0)
MCV: 95.9 fL (ref 78.0–100.0)
Platelets: 224 10*3/uL (ref 150–400)
RBC: 2.96 MIL/uL — ABNORMAL LOW (ref 3.87–5.11)
RDW: 12.7 % (ref 11.5–15.5)
WBC: 6.8 10*3/uL (ref 4.0–10.5)

## 2018-03-09 LAB — MRSA PCR SCREENING: MRSA by PCR: NEGATIVE

## 2018-03-09 LAB — APTT: aPTT: 29 seconds (ref 24–36)

## 2018-03-09 SURGERY — LEFT HEART CATH AND CORONARY ANGIOGRAPHY
Anesthesia: LOCAL

## 2018-03-09 MED ORDER — NITROGLYCERIN 1 MG/10 ML FOR IR/CATH LAB
INTRA_ARTERIAL | Status: AC
  Filled 2018-03-09: qty 10

## 2018-03-09 MED ORDER — FAMOTIDINE IN NACL 20-0.9 MG/50ML-% IV SOLN
INTRAVENOUS | Status: AC | PRN
  Administered 2018-03-09: 20 mg via INTRAVENOUS

## 2018-03-09 MED ORDER — SODIUM CHLORIDE 0.9% FLUSH: 3.0000 mL | Freq: Two times a day (BID) | INTRAVENOUS | Status: DC

## 2018-03-09 MED ORDER — MIRTAZAPINE 15 MG PO TABS
15.0000 mg | ORAL_TABLET | Freq: Every day | ORAL | Status: DC
  Administered 2018-03-09: 15 mg via ORAL
  Filled 2018-03-09: qty 1

## 2018-03-09 MED ORDER — ACETAMINOPHEN 325 MG PO TABS: 650.0000 mg | ORAL_TABLET | ORAL | Status: DC | PRN

## 2018-03-09 MED ORDER — NOREPINEPHRINE BITARTRATE 1 MG/ML IV SOLN
INTRAVENOUS | Status: AC | PRN
  Administered 2018-03-09: 10 ug/min via INTRAVENOUS

## 2018-03-09 MED ORDER — ONDANSETRON HCL 4 MG/2ML IJ SOLN
4.0000 mg | Freq: Four times a day (QID) | INTRAMUSCULAR | Status: DC | PRN
  Filled 2018-03-09: qty 2

## 2018-03-09 MED ORDER — QUETIAPINE FUMARATE 50 MG PO TABS
50.0000 mg | ORAL_TABLET | Freq: Every day | ORAL | Status: DC
  Administered 2018-03-09: 50 mg via ORAL
  Filled 2018-03-09: qty 1

## 2018-03-09 MED ORDER — LIDOCAINE HCL (PF) 1 % IJ SOLN
INTRAMUSCULAR | Status: AC
  Filled 2018-03-09: qty 30

## 2018-03-09 MED ORDER — HEPARIN (PORCINE) IN NACL 1000-0.9 UT/500ML-% IV SOLN
INTRAVENOUS | Status: AC
  Filled 2018-03-09: qty 1000

## 2018-03-09 MED ORDER — SODIUM CHLORIDE 0.9 % IV SOLN
INTRAVENOUS | Status: AC | PRN
  Administered 2018-03-09: 1.75 mg/kg/h via INTRAVENOUS

## 2018-03-09 MED ORDER — HEPARIN (PORCINE) IN NACL 2-0.9 UNITS/ML
INTRAMUSCULAR | Status: AC | PRN
  Administered 2018-03-09 (×2): 500 mL via INTRA_ARTERIAL

## 2018-03-09 MED ORDER — SODIUM CHLORIDE 0.9 % IV SOLN
INTRAVENOUS | Status: AC | PRN
  Administered 2018-03-09: 250 mL via INTRAVENOUS

## 2018-03-09 MED ORDER — LIDOCAINE HCL (PF) 1 % IJ SOLN
INTRAMUSCULAR | Status: DC | PRN
  Administered 2018-03-09: 18 mL

## 2018-03-09 MED ORDER — SODIUM CHLORIDE 0.9% FLUSH: 3.0000 mL | INTRAVENOUS | Status: DC | PRN

## 2018-03-09 MED ORDER — NOREPINEPHRINE 4 MG/250ML-% IV SOLN
INTRAVENOUS | Status: AC
  Filled 2018-03-09: qty 250

## 2018-03-09 MED ORDER — DONEPEZIL HCL 10 MG PO TABS: 10.0000 mg | ORAL_TABLET | Freq: Every day | ORAL | Status: DC

## 2018-03-09 MED ORDER — METHYLPREDNISOLONE SODIUM SUCC 125 MG IJ SOLR
INTRAMUSCULAR | Status: AC
  Filled 2018-03-09: qty 2

## 2018-03-09 MED ORDER — BIVALIRUDIN BOLUS VIA INFUSION - CUPID
INTRAVENOUS | Status: DC | PRN
  Administered 2018-03-09: 48.75 mg via INTRAVENOUS

## 2018-03-09 MED ORDER — MEMANTINE HCL 10 MG PO TABS
10.0000 mg | ORAL_TABLET | Freq: Two times a day (BID) | ORAL | Status: DC
  Administered 2018-03-09: 10 mg via ORAL
  Filled 2018-03-09 (×2): qty 1

## 2018-03-09 MED ORDER — ASPIRIN 81 MG PO CHEW: 81.0000 mg | CHEWABLE_TABLET | Freq: Every day | ORAL | Status: DC

## 2018-03-09 MED ORDER — ATORVASTATIN CALCIUM 80 MG PO TABS: 80.0000 mg | ORAL_TABLET | Freq: Every day | ORAL | Status: DC

## 2018-03-09 MED ORDER — IOHEXOL 350 MG/ML SOLN
INTRAVENOUS | Status: DC | PRN
  Administered 2018-03-09: 125 mL via INTRA_ARTERIAL

## 2018-03-09 MED ORDER — SODIUM CHLORIDE 0.9 % IV SOLN
INTRAVENOUS | Status: AC
  Administered 2018-03-09 (×2): via INTRAVENOUS

## 2018-03-09 MED ORDER — MONTELUKAST SODIUM 10 MG PO TABS: 10.0000 mg | ORAL_TABLET | Freq: Every day | ORAL | Status: DC

## 2018-03-09 MED ORDER — SODIUM CHLORIDE 0.9 % IV SOLN: 250.0000 mL | INTRAVENOUS | Status: DC | PRN

## 2018-03-09 MED ORDER — TRAMADOL HCL 50 MG PO TABS
50.0000 mg | ORAL_TABLET | Freq: Four times a day (QID) | ORAL | Status: DC | PRN
  Administered 2018-03-09: 50 mg via ORAL
  Filled 2018-03-09: qty 1

## 2018-03-09 MED ORDER — METHYLPREDNISOLONE SODIUM SUCC 125 MG IJ SOLR
INTRAMUSCULAR | Status: DC | PRN
  Administered 2018-03-09: 125 mg via INTRAVENOUS

## 2018-03-09 MED ORDER — ONDANSETRON HCL 4 MG/2ML IJ SOLN: 4.0000 mg | Freq: Four times a day (QID) | INTRAMUSCULAR | Status: DC | PRN

## 2018-03-09 MED ORDER — ASPIRIN EC 81 MG PO TBEC: 81.0000 mg | DELAYED_RELEASE_TABLET | Freq: Every day | ORAL | Status: DC

## 2018-03-09 MED ORDER — DIAZEPAM 5 MG PO TABS: 5.0000 mg | ORAL_TABLET | Freq: Four times a day (QID) | ORAL | Status: DC | PRN

## 2018-03-09 MED ORDER — NITROGLYCERIN 0.4 MG SL SUBL: 0.4000 mg | SUBLINGUAL_TABLET | SUBLINGUAL | Status: DC | PRN

## 2018-03-09 MED ORDER — NOREPINEPHRINE 4 MG/250ML-% IV SOLN
0.0000 ug/min | INTRAVENOUS | Status: DC
  Administered 2018-03-09 (×2): 5 ug/min via INTRAVENOUS
  Filled 2018-03-09: qty 250

## 2018-03-09 MED ORDER — ROPINIROLE HCL 0.5 MG PO TABS
0.5000 mg | ORAL_TABLET | Freq: Every day | ORAL | Status: DC
  Administered 2018-03-09: 0.5 mg via ORAL
  Filled 2018-03-09: qty 1

## 2018-03-09 MED ORDER — DIPHENHYDRAMINE HCL 50 MG/ML IJ SOLN
INTRAMUSCULAR | Status: DC | PRN
  Administered 2018-03-09: 25 mg via INTRAVENOUS

## 2018-03-09 MED ORDER — VERAPAMIL HCL 2.5 MG/ML IV SOLN
INTRAVENOUS | Status: AC
  Filled 2018-03-09: qty 2

## 2018-03-09 MED ORDER — HYDROCODONE-ACETAMINOPHEN 5-325 MG PO TABS
1.0000 | ORAL_TABLET | Freq: Four times a day (QID) | ORAL | Status: DC | PRN
Start: 2018-03-09 — End: 2018-03-10

## 2018-03-09 MED ORDER — BIVALIRUDIN TRIFLUOROACETATE 250 MG IV SOLR
INTRAVENOUS | Status: AC
  Filled 2018-03-09: qty 250

## 2018-03-09 MED ORDER — DIPHENHYDRAMINE HCL 50 MG/ML IJ SOLN
INTRAMUSCULAR | Status: AC
  Filled 2018-03-09: qty 1

## 2018-03-09 MED ORDER — FAMOTIDINE IN NACL 20-0.9 MG/50ML-% IV SOLN
INTRAVENOUS | Status: AC
  Filled 2018-03-09: qty 50

## 2018-03-09 SURGICAL SUPPLY — 13 items
CATH INFINITI 5 FR 3DRC (CATHETERS) ×1 IMPLANT
CATH INFINITI 5 FR JR5 (CATHETERS) ×1 IMPLANT
CATH INFINITI 5FR MULTPACK ANG (CATHETERS) ×1 IMPLANT
CATH VISTA GUIDE 6FR XBLAD3.5 (CATHETERS) ×1 IMPLANT
DEVICE RAD COMP TR BAND LRG (VASCULAR PRODUCTS) IMPLANT
ELECT DEFIB PAD ADLT CADENCE (PAD) ×1 IMPLANT
KIT ENCORE 26 ADVANTAGE (KITS) ×1 IMPLANT
KIT HEART LEFT (KITS) ×2 IMPLANT
PACK CARDIAC CATHETERIZATION (CUSTOM PROCEDURE TRAY) ×2 IMPLANT
SHEATH PINNACLE 6F 10CM (SHEATH) ×1 IMPLANT
TRANSDUCER W/STOPCOCK (MISCELLANEOUS) ×2 IMPLANT
TUBING CIL FLEX 10 FLL-RA (TUBING) ×2 IMPLANT
WIRE EMERALD 3MM-J .035X150CM (WIRE) ×1 IMPLANT

## 2018-03-09 NOTE — Progress Notes (Signed)
Sheath pulled at 2130 by Casilda Carls, RN observed by Myles Gip, RN. Manual pressure held for 25 minutes until 2155. VSS throughout procedure. Pt alert and oriented, talking throughout. No hematoma, bruising, or oozing noted. Pt educated about bedrest needs as well as how to splint incision when coughing or sneezing.

## 2018-03-09 NOTE — Code Documentation (Signed)
  Patient Name: Sandra [name]   MRN: [id number]   Date of Birth/ Sex: [birth year] , female      Admission Date: 03/18/2018  Attending Provider: Troy Sine, MD  Primary Diagnosis: STEMI (ST elevation myocardial infarction) [facility]) [I21.3]   Indication: Pt was in her usual state of health until this PM, when she was noted to be bradycardic and subsequently loss pulses. Code blue was subsequently called. At the time of arrival on scene, ACLS protocol was underway.   Technical Description:  - CPR performance duration:  28 minutes  - Was defibrillation or cardioversion used? Yes   - Was external pacer placed? No  - Was patient intubated pre/post CPR? Yes   Medications Administered: Y = Yes; Blank = No Amiodarone    Atropine    Calcium    Epinephrine  Y  Lidocaine  Y  Magnesium  Y  Norepinephrine    Phenylephrine    Sodium bicarbonate  Y  Vasopressin    Other    Post CPR evaluation:  - Final Status - Was patient successfully resuscitated ? No   Miscellaneous Information:  - Time of death:  2256/12/10 PM  - Primary team notified?  Yes  - Family Notified? Yes     Asencion Noble, MD   03/26/2018, 11:34 PM

## 2018-03-09 NOTE — Progress Notes (Signed)
CRITICAL VALUE ALERT  Critical Value:  Troponin 23.62   Date & Time Notied:  03/22/2018 at 2203  Provider Notified: Elson Areas, MD   Orders Received/Actions taken: No new orders given at this time.

## 2018-03-09 NOTE — Progress Notes (Addendum)
Patient bradycardic and agonal breathing upon assessment. Patient then lost pulse and went PEA. Code blue was initiated during event. ACLS protocol took place. Family called to bedside while ACLS protocol was ongoing. Chaplain at bedside with family. Prior to event, patient was alert and oriented and in a pleasant mood.

## 2018-03-09 NOTE — Progress Notes (Signed)
   04/06/2018 2300  Clinical Encounter Type  Visited With Patient;Family;Health care provider  Visit Type Critical Care;Code;Death  Referral From Nurse  Consult/Referral To Chaplain  Spiritual Encounters  Spiritual Needs Emotional;Grief support  Stress Factors  Family Stress Factors Loss   Responded to a Code Blue.  Upon arrival the nurse had called the family to come back.  Met the family and got the physician with them.  The decision was made to stop CPR.  Supported the family and prayed with them at bedside.  Will obtain the necessary information.  Will follow and support as needed. Chaplain Katherene Ponto

## 2018-03-09 NOTE — H&P (Addendum)
Cardiology Admission History and Physical:   Patient ID: Sandra [name]; MRN: [id number]; DOB: [birth year]   Admission date: 03/18/2018  Primary Care Provider: Myrlene Broker, MD Primary Cardiologist: New to [place] Primary Electrophysiologist:  None  Chief Complaint:  Chest pain  Patient Profile:   Sandra [name] is a 84 y.o. female with a history of HTN, HLD, aortic and mitral regurgitation, dementia, and RA who presented with CP and was found to have a STEMI.   History of Present Illness:   Sandra [name] was in her usual state of health until this afternoon when she experienced sudden onset chest pain while shopping with her daughter at [facility]. She states she had associated SOB and diaphoresis. She denied nausea, vomiting, dizziness, lightheadedness, or syncope. She states she has never experienced chest pain in the past and has no known heart disease history.   She does not follow with a cardiologist. Her HTN is managed by her PCP and she reports good control with current medications (Olmesartan and HCTZ). She does not recall prior ischemic evaluation and none are noted in either epic or care everywhere. She had a remote echocardiogram in 2011 with EF 58% and mild AR, MR, and TR noted.   On arrival she was hypotensive to the 80s, otherwise VSS. EKG by EMS with STE in anteroseptal leads. She was given premedications for contrast allergy and taken emergently to the cath lab for cardiac catheterization.   Past Medical History:  Diagnosis Date  . Abnormality of gait 01/03/2015  . GERD (gastroesophageal reflux disease)   . Headache   . HLD (hyperlipidemia)   . HTN (hypertension)   . Hypertension   . Memory difficulties 01/03/2015  . Mitral regurgitation    mild to moderate  . Rheumatoid arthritis(714.0)   . Syncope    recurrent/unexplained. Had a loop recorder in 2008 without documented arrhythmia    Past Surgical History:  Procedure Laterality Date  . ABDOMINAL HYSTERECTOMY     . KNEE SURGERY Left 2010  . NASAL SINUS SURGERY    . SHOULDER SURGERY       Medications Prior to Admission: Prior to Admission medications   Medication Sig Start Date End Date Taking? Authorizing Provider  aspirin 81 MG tablet Take 81 mg by mouth daily.    [provider]  cholecalciferol (VITAMIN D) 1000 UNITS tablet Take 2,000 Units by mouth daily.      [provider]  denosumab (PROLIA) 60 MG/ML SOLN injection Inject 60 mg into the skin every 6 (six) months. Administer in upper arm, thigh, or abdomen    [provider]  oxyCODONE (OXY IR/ROXICODONE) 5 MG immediate release tablet Take 5 mg by mouth every 8 (eight) hours as needed for severe pain.    [provider]  rosuvastatin (CRESTOR) 5 MG tablet Take 5 mg by mouth daily.    [provider]  traZODone (DESYREL) 50 MG tablet TAKE ONE TABLET BY MOUTH AT BEDTIME 06/11/15   Sandra Ducking, MD  valsartan-hydrochlorothiazide (DIOVAN-HCT) 320-25 MG per tablet Take 1 tablet by mouth daily.    [provider]     Allergies:    Allergies  Allergen Reactions  . Amoxicillin-Pot Clavulanate   . Ciprofloxacin   . Codeine   . Iodinated Diagnostic Agents   . Metoprolol   . Penicillins   . Sulfa Antibiotics     Social History:   Social History   Socioeconomic History  . Marital status: Widowed  Spouse name: Not on file  . Number of children: 1  . Years of education: 48  . Highest education level: Not on file  Occupational History  . Occupation: Retired  Scientific laboratory technician  . Financial resource strain: Not on file  . Food insecurity:    Worry: Not on file    Inability: Not on file  . Transportation needs:    Medical: Not on file    Non-medical: Not on file  Tobacco Use  . Smoking status: Never Smoker  . Smokeless tobacco: Never Used  Substance and Sexual Activity  . Alcohol use: No  . Drug use: No  . Sexual activity: Not on file  Lifestyle  . Physical activity:     Days per week: Not on file    Minutes per session: Not on file  . Stress: Not on file  Relationships  . Social connections:    Talks on phone: Not on file    Gets together: Not on file    Attends religious service: Not on file    Active member of club or organization: Not on file    Attends meetings of clubs or organizations: Not on file    Relationship status: Not on file  . Intimate partner violence:    Fear of current or ex partner: Not on file    Emotionally abused: Not on file    Physically abused: Not on file    Forced sexual activity: Not on file  Other Topics Concern  . Not on file  Social History Narrative   Patient is right handed.   Patient drinks 2 cups of coffee daily.    Family History:  She reports mother with MI in her 51s and Brother with MI but does not recall age of onset.  The patient's family history includes Cancer in her brother; Coronary artery disease in her unknown relative; Dementia in her brother and sister; Hypercholesterolemia in her father and mother; Hypertension in her brother, father, and mother; Ovarian cancer in her sister; Stroke in her father and mother.    ROS:  Please see the history of present illness.  All other ROS reviewed and negative.     Physical Exam/Data:   Vitals:   04/03/2018 1648  SpO2: 92%   No intake or output data in the 24 hours ending 04/02/2018 1651 There were no vitals filed for this visit. There is no height or weight on file to calculate BMI.  General:  Elderly female, well nourished, well developed, in no acute distress HEENT: sclera anicteric Neck: no JVD Vascular: No carotid bruits; distal pulses 2+ bilaterally  Cardiac:  normal S1, S2; RRR; +murmur, no rubs or gallops Lungs:  clear to auscultation bilaterally, no wheezing, rhonchi or rales  Abd: soft, nontender, no hepatomegaly  Ext: no edema Musculoskeletal:  No deformities, BUE and BLE strength normal and equal Skin: warm and dry  Neuro:  CNs 2-12 intact,  no focal abnormalities noted Psych:  Normal affect    EKG:  Prior to arrival, sinus rhythm with STE in anteroseptal leads  Relevant CV Studies: Echo 2011: - EF 58% - Mild to moderate MR - Mild AR and TR  Laboratory Data:  ChemistryNo results for input(s): NA, K, CL, CO2, GLUCOSE, BUN, CREATININE, CALCIUM, GFRNONAA, GFRAA, ANIONGAP in the last 168 hours.  No results for input(s): PROT, ALBUMIN, AST, ALT, ALKPHOS, BILITOT in the last 168 hours. HematologyNo results for input(s): WBC, RBC, HGB, HCT, MCV, MCH, MCHC, RDW, PLT in the  last 168 hours. Cardiac EnzymesNo results for input(s): TROPONINI in the last 168 hours. No results for input(s): TROPIPOC in the last 168 hours.  BNPNo results for input(s): BNP, PROBNP in the last 168 hours.  DDimer No results for input(s): DDIMER in the last 168 hours.  Radiology/Studies:  No results found.  Assessment and Plan:   1. STEMI: patient presented with sudden onset chest pain starting around 2pm today while shopping at [place] with her daughter. She had associated diaphoresis and SOB. EMS was activated and she was found to have STE in anteroseptal leads on EKG. She was taken urgently to cath lab for cardiac catheterization.  - Continue ASA and statin for now - Additional antiplatelets to be ordered by Dr. Claiborne Billings if warranted. - Check troponins x3 or to peak - Will check an echocardiogram  2. HTN: reports this is well controlled with medications prior to admission. Has been hypotensive since arrival - Will hold home BP medications (olmesartan and HCTZ) and restart as BP and Cr allows  3. Murmur: noted to have both mitral and aortic valve disease per PCP records.  - Will check an echocardiogram   4. HLD: on crestor 5mg  daily outpatient - Will check FLP in AM - Will start atorvastatin 80mg  daily  5. Dementia:  - Continue home aricept, namenda, remeron, and seroquel    Severity of Illness: The appropriate patient status for this patient  is INPATIENT. Inpatient status is judged to be reasonable and necessary in order to provide the required intensity of service to ensure the patient's safety. The patient's presenting symptoms, physical exam findings, and initial radiographic and laboratory data in the context of their chronic comorbidities is felt to place them at high risk for further clinical deterioration. Furthermore, it is not anticipated that the patient will be medically stable for discharge from the hospital within 2 midnights of admission. The following factors support the patient status of inpatient.   " The patient's presenting symptoms include chest pain. " The worrisome physical exam findings include murmur noted on cardiac exams. " The initial radiographic and laboratory data are worrisome because of EKG with STE in anteroseptal leads. " The chronic co-morbidities include HTN, HLD.   * I certify that at the point of admission it is my clinical judgment that the patient will require inpatient hospital care spanning beyond 2 midnights from the point of admission due to high intensity of service, high risk for further deterioration and high frequency of surveillance required.*    For questions or updates, please contact [place] Please consult [email or web address] for contact info under Cardiology/STEMI.    Signed, Abigail Butts, PA-C  03/16/2018 4:51 PM    Patient seen and examined. Agree with assessment and plan. Ms. Sandra [name] is an 84 year old female who has a history of hypertension, hyperlipidemia, and recent dementia according to family members.  The patient denied any prior chest pain history but today developed sudden onset severe chest pain while shopping with daughter associated with diaphoresis and shortness of breath.  Upon EMS arrival she was found to have ST elevation in the anterolateral leads highly suggestive of anterior STEMI.  She was brought emergently to the catheterization laboratory.  On  arrival to the laboratory she was having 9 out of 10 chest pain and blood pressure was 70 systolic.  She was started on levophed and given a fluid bolus.  Plan emergent cardiac catheterization with possible PCI if indicated.  Troy Sine, MD, [facility] 03/16/2018 7:07  PM   

## 2018-03-09 NOTE — Progress Notes (Signed)
ANTICOAGULATION CONSULT NOTE - Initial Consult  Pharmacy Consult for heparin Indication: chest pain/ACS  Allergies  Allergen Reactions  . Amoxicillin-Pot Clavulanate   . Ciprofloxacin   . Codeine   . Iodinated Diagnostic Agents   . Metoprolol   . Penicillins   . Sulfa Antibiotics     Patient Measurements: Height: 5' 2.5" (158.8 cm) Weight: 137 lb 5.6 oz (62.3 kg) IBW/kg (Calculated) : 51.25  Vital Signs: Temp: 97.6 F (36.4 C) (07/02 1837) Temp Source: Oral (07/02 1837) BP: 94/74 (07/02 1900) Pulse Rate: 88 (07/02 1900)  Labs: Recent Labs    03/18/2018 1659 03/16/2018 1702  HGB 9.8* 9.5*  HCT 28.4* 28.0*  PLT 224  --   APTT 29  --   LABPROT 14.8  --   INR 1.17  --   CREATININE 1.21* 1.20*  TROPONINI 3.74*  --     Estimated Creatinine Clearance: 32.3 mL/min (A) (by C-G formula based on SCr of 1.2 mg/dL (H)).   Medical History: Past Medical History:  Diagnosis Date  . Abnormality of gait 01/03/2015  . GERD (gastroesophageal reflux disease)   . Headache   . HLD (hyperlipidemia)   . HTN (hypertension)   . Hypertension   . Memory difficulties 01/03/2015  . Mitral regurgitation    mild to moderate  . Rheumatoid arthritis(714.0)   . Syncope    recurrent/unexplained. Had a loop recorder in 2008 without documented arrhythmia    Medications:  Medications Prior to Admission  Medication Sig Dispense Refill Last Dose  . donepezil (ARICEPT) 10 MG tablet Take 10 mg by mouth daily.      . hydrochlorothiazide (HYDRODIURIL) 25 MG tablet Take 12.5 mg by mouth daily.     . memantine (NAMENDA) 10 MG tablet Take 10 mg by mouth 2 (two) times daily.     . mirtazapine (REMERON) 15 MG tablet Take 15 mg by mouth at bedtime.     . montelukast (SINGULAIR) 10 MG tablet Take 10 mg by mouth at bedtime.     Marland Kitchen olmesartan (BENICAR) 40 MG tablet Take 40 mg by mouth daily.     . QUEtiapine (SEROQUEL) 50 MG tablet Take 50 mg by mouth at bedtime.     Marland Kitchen rOPINIRole (REQUIP) 0.5 MG tablet  Take 0.5 mg by mouth at bedtime.     Marland Kitchen aspirin 81 MG tablet Take 81 mg by mouth daily.   Taking  . rosuvastatin (CRESTOR) 5 MG tablet Take 5 mg by mouth daily.   Taking    Assessment: 84 y/o female who experienced sudden chest pain while shopping at [facility]. Code STEMI called and she was taken to the cath lab - ACS likely secondary to acute Takotsubo cardiomyopathy, echo to assess for mid cavity obstruction. Pharmacy consulted to begin IV heparin 8 hrs after sheath removal. Sheath remains intact currently. No bleeding noted.  Goal of Therapy:  Heparin level 0.3-0.7 units/ml Monitor platelets by anticoagulation protocol: Yes   Plan:  - F/U sheath removal and ability to begin IV heparin  - Begin heparin drip at 900 units/hr with no bolus 8 hrs post sheath removal - 8 hr heparin level - Daily heparin level and CBC - Monitor for s/sx of bleeding   Renold Genta, PharmD, BCPS Clinical Pharmacist Clinical phone for 04/06/2018 until 10p is [phone] Please check AMION for all Pharmacist numbers by unit 03/20/2018 7:15 PM

## 2018-03-09 NOTE — Progress Notes (Signed)
Patient complains of 7/10 chest pain. Patient states, "it is not as bad as before." Elson Areas, MD called and notified. Orders for Tramadol and Norco were given at this time.

## 2018-03-10 ENCOUNTER — Encounter (HOSPITAL_COMMUNITY): Payer: Self-pay | Admitting: Cardiovascular Disease

## 2018-03-10 DIAGNOSIS — F0390 Unspecified dementia without behavioral disturbance: Secondary | ICD-10-CM

## 2018-03-10 DIAGNOSIS — E785 Hyperlipidemia, unspecified: Secondary | ICD-10-CM

## 2018-03-10 DIAGNOSIS — I351 Nonrheumatic aortic (valve) insufficiency: Secondary | ICD-10-CM

## 2018-03-10 MED FILL — Nitroglycerin IV Soln 100 MCG/ML in D5W: INTRA_ARTERIAL | Qty: 10 | Status: AC

## 2018-03-10 MED FILL — Verapamil HCl IV Soln 2.5 MG/ML: INTRAVENOUS | Qty: 2 | Status: AC

## 2018-03-10 MED FILL — Heparin Sod (Porcine)-NaCl IV Soln 1000 Unit/500ML-0.9%: INTRAVENOUS | Qty: 1000 | Status: AC

## 2018-03-13 MED FILL — Medication: Qty: 1 | Status: AC

## 2018-03-19 ENCOUNTER — Telehealth: Payer: Self-pay

## 2018-03-19 NOTE — Telephone Encounter (Signed)
0/45/99 - Death Certifcate given to [facility], RN for Dr. Claiborne Billings to review/sign.   18-Apr-2018 - Received Death Certificate back from Dr. Claiborne Billings. [facility] [place] requested for D/C to be mailed directly to [facility]. Placed in outgoing mail today to [facility].

## 2018-04-08 NOTE — Discharge Summary (Signed)
Death Summary    Patient ID: Sandra [name],  MRN: [id number], DOB/AGE: Jul 14, 1934 84 y.o.  Admit date: 28-Mar-2018 Discharge date: 29-Mar-2018  Primary Care Provider: Myrlene Broker Primary Cardiologist: No primary care provider on file.  Discharge Diagnoses    Principal Problem:   STEMI (ST elevation myocardial infarction) ([place]) Active Problems:   Mitral regurgitation   HTN (hypertension)   ST elevation myocardial infarction (STEMI) ([facility])   Hyperlipidemia   Dementia   Aortic regurgitation   Allergies Allergies  Allergen Reactions  . Iodinated Diagnostic Agents Other (See Comments)    Tremors, hives, vomiting  . Metoprolol Nausea Only  . Ciprofloxacin Rash  . Codeine Nausea And Vomiting  . Penicillins Rash  . Sulfa Antibiotics Rash    Diagnostic Studies/Procedures    Left Heart catheterization March 28, 2018: RECOMMENDATION: The patient initially presented hypotensive with blood pressure systolic in the 35K.  Her initial injection in the left system did show apical blushing most likely contributed by the significant apical pressure induced by acute apical ballooning.  A 2D echo Doppler study will be ordered to assess for mid cavity obstruction and this tachycardia Subu pattern.  As long as her blood pressure stabilizes we will try to slowly wean levo fed.  If she requires additional inotropic support the addition of Neo-Synephrine may be considered.  Will DC bivalirudin upon completion of the current bag.  Will resume heparin 8 hours post sheath removal. _____________   History of Present Illness     Sandra [name] was in her usual state of health until this afternoon when she experienced sudden onset chest pain while shopping with her daughter at [facility]. She states she had associated SOB and diaphoresis. She denied nausea, vomiting, dizziness, lightheadedness, or syncope. She states she has never experienced chest pain in the past and has no known heart disease history.   She  does not follow with a cardiologist. Her HTN is managed by her PCP and she reports good control with current medications (Olmesartan and HCTZ). She does not recall prior ischemic evaluation and none are noted in either epic or care everywhere. She had a remote echocardiogram in December 21, 2009 with EF 58% and mild AR, MR, and TR noted.   On arrival she was hypotensive to the 80s, otherwise VSS. EKG by EMS with STE in anteroseptal leads. She was given premedications for contrast allergy and taken emergently to the cath lab for cardiac catheterization.     Hospital Course     Consultants: None   1. STEMI: patient presented with sudden onset chest pain starting around 2pm today while shopping at [place] with her daughter. She had associated diaphoresis and SOB. EMS was activated and she was found to have STE in anteroseptal leads on EKG. She was taken urgently to cath lab for cardiac catheterization. Her LHC did not reveal any obstructive CAD, however she was noted to evidence of Takasubo. An echo was pending. She continued to be hypotensive requiring levophed for BP support. Unfortunately in the evening, she became bradycardic with agonal breathing with subsequent PEA arrest. Code blue was initated and ACLS protocol took place. Family was brought in and the decision was made to stop CPR after 28 minutes. Time of death was March 28, 2018 at 21-Dec-2256.    _____________  Discharge Vitals Blood pressure (!) 89/59, pulse 83, temperature 97.6 F (36.4 C), temperature source Oral, resp. rate 17, height 5' 2.5" (1.588 m), weight 137 lb 5.6 oz (62.3 kg), SpO2 99 %.  Filed Weights   03/20/2018 1837  Weight: 137 lb 5.6 oz (62.3 kg)    Labs & Radiologic Studies    CBC Recent Labs    04/05/2018 1659 03/23/2018 1702  WBC 6.8  --   HGB 9.8* 9.5*  HCT 28.4* 28.0*  MCV 95.9  --   PLT 224  --    Basic Metabolic Panel Recent Labs    03/12/2018 1659 04/07/2018 1702  NA 132* 133*  K 3.6 3.6  CL 104 99  CO2 19*  --   GLUCOSE  130* 129*  BUN 21 25*  CREATININE 1.21* 1.20*  CALCIUM 10.2  --    Liver Function Tests Recent Labs    04/03/2018 1659  AST 40  ALT 25  ALKPHOS 45  BILITOT 0.5  PROT 5.1*  ALBUMIN 3.1*   No results for input(s): LIPASE, AMYLASE in the last 72 hours. Cardiac Enzymes Recent Labs    04/02/2018 1659 03/21/2018 2034  TROPONINI 3.74* 23.62*   BNP Invalid input(s): POCBNP D-Dimer No results for input(s): DDIMER in the last 72 hours. Hemoglobin A1C Recent Labs    03/15/2018 1659  HGBA1C 4.8   Fasting Lipid Panel Recent Labs    03/28/2018 1659  CHOL 117  HDL 32*  LDLCALC 61  TRIG 120  CHOLHDL 3.7   Thyroid Function Tests No results for input(s): TSH, T4TOTAL, T3FREE, THYROIDAB in the last 72 hours.  Invalid input(s): FREET3 _____________  No results found.   Discharge Medications   N/A  Outstanding Labs/Studies   None  Duration of Discharge Encounter   Greater than 30 minutes including physician time.  Signed, Abigail Butts PA-C 2018-03-15, 2:33 PM

## 2018-04-08 NOTE — Progress Notes (Signed)
Patient expired at 2258. Verified by 2 RNs. No heart sounds, lungs sounds, or spontaneous chest rise present. Time of death noted.

## 2018-04-08 NOTE — Progress Notes (Signed)
CDS called and notified of patient status.

## 2018-04-08 DEATH — deceased
